# Patient Record
Sex: Female | Born: 1960 | Race: White | Hispanic: No | Smoking: Current every day smoker
Health system: Southern US, Community
[De-identification: ages and names within clinical notes are randomized; demographics above are authoritative.]

## PROBLEM LIST (undated history)

## (undated) DIAGNOSIS — T7840XA Allergy, unspecified, initial encounter: Secondary | ICD-10-CM

## (undated) DIAGNOSIS — E785 Hyperlipidemia, unspecified: Secondary | ICD-10-CM

## (undated) DIAGNOSIS — F32A Depression, unspecified: Secondary | ICD-10-CM

## (undated) DIAGNOSIS — M1990 Unspecified osteoarthritis, unspecified site: Secondary | ICD-10-CM

## (undated) DIAGNOSIS — M549 Dorsalgia, unspecified: Secondary | ICD-10-CM

## (undated) DIAGNOSIS — F1190 Opioid use, unspecified, uncomplicated: Secondary | ICD-10-CM

## (undated) DIAGNOSIS — F329 Major depressive disorder, single episode, unspecified: Secondary | ICD-10-CM

## (undated) DIAGNOSIS — I10 Essential (primary) hypertension: Secondary | ICD-10-CM

## (undated) DIAGNOSIS — B1920 Unspecified viral hepatitis C without hepatic coma: Secondary | ICD-10-CM

## (undated) DIAGNOSIS — F319 Bipolar disorder, unspecified: Secondary | ICD-10-CM

## (undated) DIAGNOSIS — Z5189 Encounter for other specified aftercare: Secondary | ICD-10-CM

## (undated) DIAGNOSIS — R569 Unspecified convulsions: Secondary | ICD-10-CM

## (undated) DIAGNOSIS — J449 Chronic obstructive pulmonary disease, unspecified: Secondary | ICD-10-CM

## (undated) HISTORY — DX: Hyperlipidemia, unspecified: E78.5

## (undated) HISTORY — DX: Depression, unspecified: F32.A

## (undated) HISTORY — DX: Chronic obstructive pulmonary disease, unspecified: J44.9

## (undated) HISTORY — DX: Essential (primary) hypertension: I10

## (undated) HISTORY — DX: Bipolar disorder, unspecified: F31.9

## (undated) HISTORY — PX: ABDOMINAL HYSTERECTOMY: SHX81

## (undated) HISTORY — DX: Unspecified convulsions: R56.9

## (undated) HISTORY — DX: Major depressive disorder, single episode, unspecified: F32.9

## (undated) HISTORY — DX: Unspecified viral hepatitis C without hepatic coma: B19.20

## (undated) HISTORY — DX: Unspecified osteoarthritis, unspecified site: M19.90

## (undated) HISTORY — DX: Encounter for other specified aftercare: Z51.89

## (undated) HISTORY — DX: Dorsalgia, unspecified: M54.9

## (undated) HISTORY — DX: Opioid use, unspecified, uncomplicated: F11.90

## (undated) HISTORY — DX: Allergy, unspecified, initial encounter: T78.40XA

---

## 2003-12-14 ENCOUNTER — Emergency Department: Payer: Self-pay | Admitting: Emergency Medicine

## 2006-09-29 ENCOUNTER — Emergency Department: Payer: Self-pay | Admitting: Internal Medicine

## 2007-06-21 ENCOUNTER — Emergency Department: Payer: Self-pay | Admitting: Emergency Medicine

## 2008-08-17 DIAGNOSIS — Z91148 Patient's other noncompliance with medication regimen for other reason: Secondary | ICD-10-CM | POA: Insufficient documentation

## 2008-11-29 DIAGNOSIS — F3130 Bipolar disorder, current episode depressed, mild or moderate severity, unspecified: Secondary | ICD-10-CM | POA: Insufficient documentation

## 2008-11-29 DIAGNOSIS — B182 Chronic viral hepatitis C: Secondary | ICD-10-CM | POA: Insufficient documentation

## 2011-12-16 DIAGNOSIS — S069X9A Unspecified intracranial injury with loss of consciousness of unspecified duration, initial encounter: Secondary | ICD-10-CM | POA: Insufficient documentation

## 2012-02-11 DIAGNOSIS — M549 Dorsalgia, unspecified: Secondary | ICD-10-CM | POA: Insufficient documentation

## 2012-02-11 DIAGNOSIS — R4183 Borderline intellectual functioning: Secondary | ICD-10-CM | POA: Insufficient documentation

## 2012-02-11 DIAGNOSIS — R413 Other amnesia: Secondary | ICD-10-CM | POA: Insufficient documentation

## 2012-02-11 DIAGNOSIS — B1920 Unspecified viral hepatitis C without hepatic coma: Secondary | ICD-10-CM | POA: Insufficient documentation

## 2012-02-11 DIAGNOSIS — J449 Chronic obstructive pulmonary disease, unspecified: Secondary | ICD-10-CM | POA: Insufficient documentation

## 2012-02-11 DIAGNOSIS — Z79891 Long term (current) use of opiate analgesic: Secondary | ICD-10-CM | POA: Insufficient documentation

## 2012-02-11 DIAGNOSIS — Z720 Tobacco use: Secondary | ICD-10-CM | POA: Insufficient documentation

## 2012-02-11 DIAGNOSIS — G40909 Epilepsy, unspecified, not intractable, without status epilepticus: Secondary | ICD-10-CM | POA: Insufficient documentation

## 2012-02-11 DIAGNOSIS — Z7689 Persons encountering health services in other specified circumstances: Secondary | ICD-10-CM | POA: Insufficient documentation

## 2012-02-11 DIAGNOSIS — F319 Bipolar disorder, unspecified: Secondary | ICD-10-CM | POA: Insufficient documentation

## 2012-04-23 DIAGNOSIS — F329 Major depressive disorder, single episode, unspecified: Secondary | ICD-10-CM | POA: Insufficient documentation

## 2012-04-27 ENCOUNTER — Ambulatory Visit: Payer: Self-pay | Admitting: Pain Medicine

## 2012-05-05 ENCOUNTER — Ambulatory Visit: Payer: Self-pay | Admitting: Pain Medicine

## 2012-05-06 ENCOUNTER — Ambulatory Visit: Payer: Self-pay | Admitting: Pain Medicine

## 2012-06-01 ENCOUNTER — Ambulatory Visit: Payer: Self-pay | Admitting: Pain Medicine

## 2012-06-09 ENCOUNTER — Ambulatory Visit: Payer: Self-pay | Admitting: Pain Medicine

## 2012-07-01 ENCOUNTER — Ambulatory Visit: Payer: Self-pay | Admitting: Pain Medicine

## 2012-07-05 DIAGNOSIS — Z9119 Patient's noncompliance with other medical treatment and regimen: Secondary | ICD-10-CM | POA: Insufficient documentation

## 2012-07-12 DIAGNOSIS — J45909 Unspecified asthma, uncomplicated: Secondary | ICD-10-CM | POA: Insufficient documentation

## 2012-07-12 DIAGNOSIS — J309 Allergic rhinitis, unspecified: Secondary | ICD-10-CM | POA: Insufficient documentation

## 2012-07-14 ENCOUNTER — Ambulatory Visit: Payer: Self-pay | Admitting: Pain Medicine

## 2012-07-28 ENCOUNTER — Ambulatory Visit: Payer: Self-pay | Admitting: Pain Medicine

## 2012-08-11 ENCOUNTER — Ambulatory Visit: Payer: Self-pay | Admitting: Pain Medicine

## 2012-08-26 ENCOUNTER — Ambulatory Visit: Payer: Self-pay | Admitting: Pain Medicine

## 2012-09-01 ENCOUNTER — Ambulatory Visit: Payer: Self-pay | Admitting: Pain Medicine

## 2012-09-17 DIAGNOSIS — M2550 Pain in unspecified joint: Secondary | ICD-10-CM | POA: Insufficient documentation

## 2012-09-28 ENCOUNTER — Ambulatory Visit: Payer: Self-pay | Admitting: Pain Medicine

## 2012-10-09 ENCOUNTER — Emergency Department: Payer: Self-pay | Admitting: Emergency Medicine

## 2012-10-18 ENCOUNTER — Ambulatory Visit: Payer: Self-pay | Admitting: Pain Medicine

## 2012-10-20 DIAGNOSIS — G43909 Migraine, unspecified, not intractable, without status migrainosus: Secondary | ICD-10-CM | POA: Insufficient documentation

## 2012-10-27 ENCOUNTER — Ambulatory Visit: Payer: Self-pay | Admitting: Pain Medicine

## 2012-11-03 ENCOUNTER — Ambulatory Visit: Payer: Self-pay | Admitting: Pain Medicine

## 2012-11-11 ENCOUNTER — Ambulatory Visit: Payer: Self-pay | Admitting: Family Medicine

## 2012-11-24 ENCOUNTER — Ambulatory Visit: Payer: Self-pay | Admitting: Pain Medicine

## 2012-12-05 DIAGNOSIS — H6590 Unspecified nonsuppurative otitis media, unspecified ear: Secondary | ICD-10-CM | POA: Insufficient documentation

## 2012-12-05 DIAGNOSIS — J329 Chronic sinusitis, unspecified: Secondary | ICD-10-CM | POA: Insufficient documentation

## 2012-12-06 ENCOUNTER — Ambulatory Visit: Payer: Self-pay | Admitting: Pain Medicine

## 2012-12-23 ENCOUNTER — Ambulatory Visit: Payer: Self-pay | Admitting: Pain Medicine

## 2012-12-24 ENCOUNTER — Ambulatory Visit: Payer: Self-pay | Admitting: Family Medicine

## 2013-01-17 ENCOUNTER — Ambulatory Visit: Payer: Self-pay | Admitting: Pain Medicine

## 2013-01-24 ENCOUNTER — Ambulatory Visit: Payer: Self-pay | Admitting: Pain Medicine

## 2013-01-31 DIAGNOSIS — R0902 Hypoxemia: Secondary | ICD-10-CM | POA: Insufficient documentation

## 2013-02-10 ENCOUNTER — Ambulatory Visit: Payer: Self-pay | Admitting: Otolaryngology

## 2013-02-22 ENCOUNTER — Ambulatory Visit: Payer: Self-pay | Admitting: Pain Medicine

## 2013-02-24 ENCOUNTER — Ambulatory Visit: Payer: Self-pay | Admitting: Internal Medicine

## 2013-02-24 LAB — CBC CANCER CENTER
Basophil %: 0.7 %
Eosinophil #: 0.2 x10 3/mm (ref 0.0–0.7)
Eosinophil %: 1.2 %
HCT: 45.1 % (ref 35.0–47.0)
HGB: 15.1 g/dL (ref 12.0–16.0)
Lymphocyte #: 4.4 x10 3/mm — ABNORMAL HIGH (ref 1.0–3.6)
Lymphocyte %: 31.2 %
Lymphocytes: 28 %
MCHC: 33.5 g/dL (ref 32.0–36.0)
MCV: 97 fL (ref 80–100)
Monocyte #: 0.8 x10 3/mm (ref 0.2–0.9)
Monocytes: 7 %
Neutrophil #: 8.7 x10 3/mm — ABNORMAL HIGH (ref 1.4–6.5)
Neutrophil %: 61.1 %
RDW: 13.3 % (ref 11.5–14.5)

## 2013-02-24 LAB — IRON AND TIBC
Iron Bind.Cap.(Total): 304 ug/dL (ref 250–450)
Iron Saturation: 19 %
Iron: 59 ug/dL (ref 50–170)

## 2013-02-24 LAB — FERRITIN: Ferritin (ARMC): 572 ng/mL — ABNORMAL HIGH (ref 8–388)

## 2013-03-10 ENCOUNTER — Ambulatory Visit: Payer: Self-pay | Admitting: Internal Medicine

## 2013-03-24 ENCOUNTER — Ambulatory Visit: Payer: Self-pay | Admitting: Pain Medicine

## 2013-04-06 ENCOUNTER — Ambulatory Visit: Payer: Self-pay | Admitting: Pain Medicine

## 2013-04-25 ENCOUNTER — Ambulatory Visit: Payer: Self-pay | Admitting: Pain Medicine

## 2013-05-12 DIAGNOSIS — R7989 Other specified abnormal findings of blood chemistry: Secondary | ICD-10-CM | POA: Insufficient documentation

## 2013-05-12 DIAGNOSIS — Z833 Family history of diabetes mellitus: Secondary | ICD-10-CM | POA: Insufficient documentation

## 2013-05-19 ENCOUNTER — Ambulatory Visit: Payer: Self-pay | Admitting: Internal Medicine

## 2013-05-19 LAB — CANCER CENTER HEMATOCRIT: HCT: 48.2 % — ABNORMAL HIGH (ref 35.0–47.0)

## 2013-05-24 ENCOUNTER — Ambulatory Visit: Payer: Self-pay | Admitting: Pain Medicine

## 2013-06-01 ENCOUNTER — Ambulatory Visit: Payer: Self-pay | Admitting: Pain Medicine

## 2013-06-04 DIAGNOSIS — D751 Secondary polycythemia: Secondary | ICD-10-CM | POA: Insufficient documentation

## 2013-06-04 DIAGNOSIS — M549 Dorsalgia, unspecified: Secondary | ICD-10-CM | POA: Insufficient documentation

## 2013-06-07 LAB — HEMATOCRIT: HCT: 46.8 % (ref 35.0–47.0)

## 2013-06-08 ENCOUNTER — Ambulatory Visit: Payer: Self-pay | Admitting: Internal Medicine

## 2013-06-23 ENCOUNTER — Ambulatory Visit: Payer: Self-pay | Admitting: Pain Medicine

## 2013-07-06 ENCOUNTER — Ambulatory Visit: Payer: Self-pay | Admitting: Pain Medicine

## 2013-07-21 ENCOUNTER — Ambulatory Visit: Payer: Self-pay | Admitting: Internal Medicine

## 2013-07-21 LAB — HEMATOCRIT: HCT: 47.9 % — AB (ref 35.0–47.0)

## 2013-07-25 ENCOUNTER — Ambulatory Visit: Payer: Self-pay | Admitting: Pain Medicine

## 2013-08-08 ENCOUNTER — Ambulatory Visit: Payer: Self-pay | Admitting: Internal Medicine

## 2013-08-11 LAB — HEMATOCRIT: HCT: 46.7 % (ref 35.0–47.0)

## 2013-08-23 ENCOUNTER — Ambulatory Visit: Payer: Self-pay | Admitting: Pain Medicine

## 2013-09-01 ENCOUNTER — Ambulatory Visit: Payer: Self-pay | Admitting: Internal Medicine

## 2013-09-01 LAB — CBC CANCER CENTER
BASOS ABS: 0.1 x10 3/mm (ref 0.0–0.1)
Basophil %: 1.3 %
EOS ABS: 0.1 x10 3/mm (ref 0.0–0.7)
EOS PCT: 1.3 %
HCT: 44.1 % (ref 35.0–47.0)
HGB: 14.4 g/dL (ref 12.0–16.0)
LYMPHS ABS: 3.6 x10 3/mm (ref 1.0–3.6)
Lymphocyte %: 32.2 %
MCH: 31.4 pg (ref 26.0–34.0)
MCHC: 32.7 g/dL (ref 32.0–36.0)
MCV: 96 fL (ref 80–100)
MONO ABS: 0.8 x10 3/mm (ref 0.2–0.9)
MONOS PCT: 7.3 %
NEUTROS PCT: 57.9 %
Neutrophil #: 6.4 x10 3/mm (ref 1.4–6.5)
Platelet: 301 x10 3/mm (ref 150–440)
RBC: 4.6 10*6/uL (ref 3.80–5.20)
RDW: 13 % (ref 11.5–14.5)
WBC: 11 x10 3/mm (ref 3.6–11.0)

## 2013-09-07 ENCOUNTER — Ambulatory Visit: Payer: Self-pay | Admitting: Internal Medicine

## 2013-09-07 ENCOUNTER — Ambulatory Visit: Payer: Self-pay | Admitting: Pain Medicine

## 2013-09-22 ENCOUNTER — Ambulatory Visit: Payer: Self-pay | Admitting: Pain Medicine

## 2013-12-02 DIAGNOSIS — J449 Chronic obstructive pulmonary disease, unspecified: Secondary | ICD-10-CM | POA: Insufficient documentation

## 2013-12-02 DIAGNOSIS — B182 Chronic viral hepatitis C: Secondary | ICD-10-CM | POA: Insufficient documentation

## 2013-12-05 DIAGNOSIS — F419 Anxiety disorder, unspecified: Secondary | ICD-10-CM | POA: Insufficient documentation

## 2013-12-28 ENCOUNTER — Ambulatory Visit: Payer: Self-pay | Admitting: Pain Medicine

## 2013-12-30 ENCOUNTER — Ambulatory Visit: Payer: Self-pay | Admitting: Family Medicine

## 2014-01-19 ENCOUNTER — Ambulatory Visit: Payer: Self-pay | Admitting: Pain Medicine

## 2014-01-26 ENCOUNTER — Ambulatory Visit: Payer: Self-pay | Admitting: Internal Medicine

## 2014-01-26 LAB — CBC CANCER CENTER
BASOS ABS: 0.1 x10 3/mm (ref 0.0–0.1)
Basophil %: 0.6 %
Eosinophil #: 0.2 x10 3/mm (ref 0.0–0.7)
Eosinophil %: 1.6 %
HCT: 47.1 % — AB (ref 35.0–47.0)
HGB: 15.5 g/dL (ref 12.0–16.0)
LYMPHS PCT: 29.8 %
Lymphocyte #: 3.1 x10 3/mm (ref 1.0–3.6)
MCH: 31.1 pg (ref 26.0–34.0)
MCHC: 32.9 g/dL (ref 32.0–36.0)
MCV: 95 fL (ref 80–100)
Monocyte #: 0.7 x10 3/mm (ref 0.2–0.9)
Monocyte %: 6.7 %
NEUTROS PCT: 61.3 %
Neutrophil #: 6.5 x10 3/mm (ref 1.4–6.5)
Platelet: 280 x10 3/mm (ref 150–440)
RBC: 4.98 10*6/uL (ref 3.80–5.20)
RDW: 13.4 % (ref 11.5–14.5)
WBC: 10.6 x10 3/mm (ref 3.6–11.0)

## 2014-02-07 ENCOUNTER — Ambulatory Visit: Payer: Self-pay | Admitting: Internal Medicine

## 2014-02-16 ENCOUNTER — Ambulatory Visit: Payer: Self-pay | Admitting: Pain Medicine

## 2014-02-20 ENCOUNTER — Ambulatory Visit: Payer: Self-pay | Admitting: Pain Medicine

## 2014-03-02 LAB — CANCER CENTER HEMATOCRIT: HCT: 48 % — ABNORMAL HIGH (ref 35.0–47.0)

## 2014-03-07 DIAGNOSIS — E785 Hyperlipidemia, unspecified: Secondary | ICD-10-CM | POA: Insufficient documentation

## 2014-03-10 ENCOUNTER — Ambulatory Visit: Payer: Self-pay | Admitting: Internal Medicine

## 2014-03-20 ENCOUNTER — Ambulatory Visit: Payer: Self-pay | Admitting: Pain Medicine

## 2014-04-03 ENCOUNTER — Ambulatory Visit: Payer: Self-pay | Admitting: Pain Medicine

## 2014-04-20 ENCOUNTER — Ambulatory Visit: Payer: Self-pay | Admitting: Pain Medicine

## 2014-04-27 ENCOUNTER — Ambulatory Visit: Payer: Self-pay | Admitting: Internal Medicine

## 2014-05-09 ENCOUNTER — Ambulatory Visit
Admit: 2014-05-09 | Disposition: A | Discharge status: Home / Self Care | Payer: Self-pay | Attending: Internal Medicine | Admitting: Internal Medicine

## 2014-05-18 ENCOUNTER — Ambulatory Visit: Payer: Self-pay | Admitting: Pain Medicine

## 2014-06-20 ENCOUNTER — Ambulatory Visit
Admit: 2014-06-20 | Disposition: A | Discharge status: Home / Self Care | Payer: Self-pay | Attending: Pain Medicine | Admitting: Pain Medicine

## 2014-06-22 ENCOUNTER — Ambulatory Visit
Admit: 2014-06-22 | Disposition: A | Discharge status: Home / Self Care | Payer: Self-pay | Attending: Internal Medicine | Admitting: Internal Medicine

## 2014-06-26 ENCOUNTER — Ambulatory Visit
Admit: 2014-06-26 | Disposition: A | Discharge status: Home / Self Care | Payer: Self-pay | Attending: Pain Medicine | Admitting: Pain Medicine

## 2014-07-15 ENCOUNTER — Other Ambulatory Visit: Payer: Self-pay | Admitting: *Deleted

## 2014-07-15 DIAGNOSIS — D729 Disorder of white blood cells, unspecified: Secondary | ICD-10-CM

## 2014-07-15 DIAGNOSIS — D72829 Elevated white blood cell count, unspecified: Secondary | ICD-10-CM

## 2014-07-15 DIAGNOSIS — D751 Secondary polycythemia: Secondary | ICD-10-CM

## 2014-07-17 ENCOUNTER — Other Ambulatory Visit: Payer: Self-pay | Admitting: *Deleted

## 2014-07-17 DIAGNOSIS — D751 Secondary polycythemia: Secondary | ICD-10-CM

## 2014-07-20 ENCOUNTER — Other Ambulatory Visit: Payer: Medicare Other

## 2014-07-20 ENCOUNTER — Ambulatory Visit: Payer: Medicare Other | Attending: Pain Medicine | Admitting: Pain Medicine

## 2014-07-20 ENCOUNTER — Encounter: Payer: Self-pay | Admitting: Pain Medicine

## 2014-07-20 ENCOUNTER — Inpatient Hospital Stay: Payer: Medicare Other | Attending: Internal Medicine | Admitting: Internal Medicine

## 2014-07-20 VITALS — BP 135/109 | HR 94 | Temp 97.6°F | Resp 15 | Ht 60.0 in | Wt 142.0 lb

## 2014-07-20 DIAGNOSIS — M5126 Other intervertebral disc displacement, lumbar region: Secondary | ICD-10-CM | POA: Insufficient documentation

## 2014-07-20 DIAGNOSIS — M47816 Spondylosis without myelopathy or radiculopathy, lumbar region: Secondary | ICD-10-CM

## 2014-07-20 DIAGNOSIS — M533 Sacrococcygeal disorders, not elsewhere classified: Secondary | ICD-10-CM | POA: Insufficient documentation

## 2014-07-20 DIAGNOSIS — M1288 Other specific arthropathies, not elsewhere classified, other specified site: Secondary | ICD-10-CM | POA: Diagnosis not present

## 2014-07-20 DIAGNOSIS — M5136 Other intervertebral disc degeneration, lumbar region: Secondary | ICD-10-CM | POA: Insufficient documentation

## 2014-07-20 DIAGNOSIS — G43909 Migraine, unspecified, not intractable, without status migrainosus: Secondary | ICD-10-CM | POA: Diagnosis not present

## 2014-07-20 DIAGNOSIS — M545 Low back pain: Secondary | ICD-10-CM | POA: Diagnosis present

## 2014-07-20 DIAGNOSIS — M5481 Occipital neuralgia: Secondary | ICD-10-CM | POA: Diagnosis not present

## 2014-07-20 MED ORDER — OXYCODONE HCL 5 MG PO CAPS: ORAL_CAPSULE | ORAL | Status: DC

## 2014-07-20 NOTE — Progress Notes (Signed)
Patient states 54-year-old female returns to [facility] management Center for further evaluation and treatment of lower back lower extremity pain with pain fairly well controlled at this time. Patient also with history of headaches which appear to be fairly well controlled at this time. Patient is to undergo further evaluation of [facility] as planned for interventional treatment at this time and continue patient's oxycodone  Physical examination  Patient was tenderness to palpation of paraspinal musculature and cervical region cervical facet region tenderness of the splenius capitis musculature region. To be bilaterally equal tenderness of thoracic facet thoracic paraspinal musculature region of moderate degree of spasm of the lower thoracic region tenderness of the PSIS and PI is regions of moderate degree. Mild tenderness of the greater trochanteric region and iliotibial band region PSIS and PII S regions. Straight leg raising limited to approximately 30 without increased pain with dorsiflexion. Abdomen without cues to palpation no costovertebral tenderness noted  Assessment   Degenerative disc disease lumbar spine This bulging posteriorly at L2-3 L3-4 L4-5 and L5-S1 with multilevel degenerative changes noted throughout the lumbar spine  Greater occipital neuralgia  Migraine headaches  Sacroiliac joint dysfunction  Facet syndrome  Plan  #1 continue oxycodone  #2 follow-up Dr. Greggory StallionGeorge for evaluation of blood pressure GI condition and general medical condition  #3 [facility] is planned  #4 may consider additional treatment pending response to treatment and follow-up evaluation number  Call [facility] change in condition prior to scheduled return appointment

## 2014-07-20 NOTE — Progress Notes (Signed)
   Subjective:    Patient ID: Sherri Foster, female    DOB: 06-19-1960, 54 y.o.   MRN: [id number]  HPI    Review of Systems     Objective:   Physical Exam        Assessment & Plan:

## 2014-07-20 NOTE — Progress Notes (Signed)
   Subjective:    Patient ID: Sherri Foster, female    DOB: 01/09/1961, 54 y.o.   MRN: [id number]  HPI    Review of Systems     Objective:   Physical Exam        Assessment & Plan:   

## 2014-07-20 NOTE — Patient Instructions (Addendum)
Continue present medications.  F/U PCP for evaliation of  BP and general medical  condition. Have patient see Dr. Greggory StallionGeorge for elevated blood pressure today or this week  [facility]  F/U surgical evaluation.  F/U nrurological evaluation.  May consider radiofrequency rhizolysis or intraspinal procedures pending response to present treatment and F/U evaluation.  Patient to call [facility] should patient have concerns prior to scheduled return appointment.

## 2014-07-20 NOTE — Progress Notes (Signed)
dishcarge  Patient home, ambulatory at 0905 hrs Script oxycodone given.

## 2014-07-20 NOTE — Progress Notes (Signed)
Pt going  To [facility] was put on harvoni, and now having diarrhea . [facility].

## 2014-07-27 ENCOUNTER — Telehealth: Payer: Self-pay | Admitting: Pain Medicine

## 2014-07-27 NOTE — Telephone Encounter (Signed)
Ms Sherri Foster daughter called to tell her mother selling meds for cocaine, and would like for us to have her mother called in for a random drug test. Daughter is very worried about mother.

## 2014-07-27 NOTE — Telephone Encounter (Signed)
Sherri SheldonAshley and Sherri BoerVicki,  As discussed I agreed that we call patient and request that she come today for urine drug screen  Please call patient now

## 2014-07-27 NOTE — Telephone Encounter (Signed)
Spoke with Chip BoerVicki and Dr Metta Clinesrisp and they agree to have the pt come in for random drug screen.   I attempted to call the pt at 2:45pm to informed pt she needed to be here by 4:00 today to the drug screen. Pt did not answer phone , i left detailed message stating pt needed to come to clinic today 07/27/14 before 4:00pm to give a random drug screen.   Pt daughter Silva BandyKristi also stated that she was gonna call Alean RinneGail Williams whom Ms Willeen CassBennett lives with and inform her that she needs to bring Ms Willeen CassBennett to the clinic today for the drug test.

## 2014-07-31 ENCOUNTER — Telehealth: Payer: Self-pay | Admitting: *Deleted

## 2014-07-31 NOTE — Telephone Encounter (Signed)
Pt was called multiple times with messages left on 07/27/14 stating she needed to come in and call us back.   Pt never showed up nor called

## 2014-07-31 NOTE — Telephone Encounter (Signed)
Morrie Sheldonshley  Thank you for trying To reach Mrs. Willeen CassBennett again please inform Chip BoerVicki as well of inability to reach patient

## 2014-07-31 NOTE — Telephone Encounter (Signed)
Phone call encounter closed. Pateint called and left voice mails/ messages. Did Not Respond.

## 2014-07-31 NOTE — Telephone Encounter (Signed)
Tried to call pt and phone goes right to vm . I left pt voice mail at 3:24 on 07/31/14

## 2014-07-31 NOTE — Telephone Encounter (Signed)
Annett FabianVicki, Ashley, and nurses  As a follow-up from my attempt to reach patient on Thursday, 07/27/2014, I am requesting that you call patient today and have patient report for urine drug screen today as well

## 2014-07-31 NOTE — Telephone Encounter (Signed)
closed

## 2014-08-02 ENCOUNTER — Other Ambulatory Visit: Payer: Self-pay | Admitting: Pain Medicine

## 2014-08-02 ENCOUNTER — Other Ambulatory Visit: Payer: Self-pay | Admitting: *Deleted

## 2014-08-02 DIAGNOSIS — D751 Secondary polycythemia: Secondary | ICD-10-CM

## 2014-08-02 DIAGNOSIS — D729 Disorder of white blood cells, unspecified: Secondary | ICD-10-CM

## 2014-08-02 DIAGNOSIS — D72829 Elevated white blood cell count, unspecified: Secondary | ICD-10-CM

## 2014-08-02 NOTE — Telephone Encounter (Signed)
Thank you for the follow-up information regarding this patient and request for patient to come to clinic for urine drug screen. Patient's most recent urine drug screen has been reviewed and is without evidence of the substance which patient was reported to be taking. We will also address issues with patient at time of patient's return appointment.

## 2014-08-02 NOTE — Telephone Encounter (Signed)
08/01/14 afternoon : Sherri Foster talk to Sherri Foster and she told her she was in [place] with her daughter and said she could not come in for the drug test.   08/02/14 9:40 per Sherri Foster: Daughter Sherri Foster  Called and stated mother was not in [place] and has not been there with her because she is the only daughter. Daughter has contacted a [employer] and the [employer] and they are checking into it and investigating the doctor as well as patient according to the daughter..  And daughter states that pt knows how to pass urine drug screen and would like a blood drug screen done.

## 2014-08-03 ENCOUNTER — Inpatient Hospital Stay: Payer: Medicare Other

## 2014-08-03 ENCOUNTER — Other Ambulatory Visit: Payer: Medicare Other

## 2014-08-15 ENCOUNTER — Other Ambulatory Visit: Payer: Self-pay | Admitting: Pain Medicine

## 2014-08-21 DIAGNOSIS — I10 Essential (primary) hypertension: Secondary | ICD-10-CM | POA: Insufficient documentation

## 2014-08-22 ENCOUNTER — Encounter: Payer: Self-pay | Admitting: Pain Medicine

## 2014-08-22 ENCOUNTER — Ambulatory Visit: Payer: Medicare Other | Attending: Pain Medicine | Admitting: Pain Medicine

## 2014-08-22 VITALS — BP 114/79 | HR 79 | Temp 97.7°F | Resp 16 | Ht 60.0 in | Wt 156.0 lb

## 2014-08-22 DIAGNOSIS — M5481 Occipital neuralgia: Secondary | ICD-10-CM | POA: Diagnosis not present

## 2014-08-22 DIAGNOSIS — M533 Sacrococcygeal disorders, not elsewhere classified: Secondary | ICD-10-CM | POA: Diagnosis not present

## 2014-08-22 DIAGNOSIS — M545 Low back pain: Secondary | ICD-10-CM | POA: Diagnosis present

## 2014-08-22 DIAGNOSIS — M7060 Trochanteric bursitis, unspecified hip: Secondary | ICD-10-CM | POA: Diagnosis not present

## 2014-08-22 DIAGNOSIS — M5136 Other intervertebral disc degeneration, lumbar region: Secondary | ICD-10-CM | POA: Diagnosis not present

## 2014-08-22 DIAGNOSIS — M5126 Other intervertebral disc displacement, lumbar region: Secondary | ICD-10-CM | POA: Insufficient documentation

## 2014-08-22 DIAGNOSIS — M47816 Spondylosis without myelopathy or radiculopathy, lumbar region: Secondary | ICD-10-CM | POA: Insufficient documentation

## 2014-08-22 MED ORDER — OXYCODONE HCL 5 MG PO CAPS: ORAL_CAPSULE | ORAL | Status: DC

## 2014-08-22 NOTE — Progress Notes (Signed)
   Subjective:    Patient ID: Sherri Foster, female    DOB: Dec 19, 1960, 54 y.o.   MRN: [id number]  HPI  Patient is 54 year old female who [facility] for further evaluation and treatment of pain involving the lower back and lower extremity region predominantly with pain aggravated by standing walking and twisting and turning maneuvers. The patient states that the pain also is increased with turning over in bed. Patient states the pain is more intense after prolonged standing and walking. Patient admits to significant muscle spasms occurring with the back pain after prolonged standing and walking especially. Patient states she also has headaches with pain radiating from the neck to the back of the head. We discussed patient's condition and will consider patient for lumbar facet, medial branch nerve, blocks at time return appointment in attempt to decrease severity of symptoms, minimize progression of symptoms, and hopefully avoid the need for more involved treatment. The patient was understanding and in agreement with suggested treatment plan.      Review of Systems     Objective:   Physical Exam  There was tennis to palpation over the splenius capitis and occipitalis musculature regions. Palpation over the region of the cervical facet cervical paraspinal musculature region was associated with moderate discomfort. There was no evidence of newly formed lesions of the head and neck noted. Palpation over the cervical facet cervical paraspinal musculature region and thoracic facet thoracic paraspinal musculature region reproduced moderate discomfort. There was tends to palpation over the lumbar paraspinal musculature region lumbar facet region of moderate to moderately severe degree with extension and rotation and palpation over the lumbar facets reproducing moderate to moderately severe discomfort. There was moderate tenderness over the PSIS PSIS regions. Palpation of the  gluteal and piriformis musculature regions reproduced moderate discomfort as well. Leg raising was tolerates approximately 30 without increase of pain with dorsiflexion noted. There was negative clonus negative Homans. No definite sensory deficit of dermatomal distribution detected. Abdomen nontender tender and no costovertebral tenderness was noted.      Assessment & Plan:  Degenerative disc disease lumbar spine  posterior disc bulging L2-3, L3-4, L4-5 and L5-S1 with multilevel degenerative changes noted throughout the lumbar spine  Lumbar facet syndrome  Sacroiliac joint dysfunction  Greater occipital neuralgia, bilateral  Greater trochanteric bursitis    Plan  Continue present medications. Oxycodone   Lumbar facet, medial branch nerve, blocks to be performed at time return appointment  F/U PCP for evaliation of  BP and general medical  condition.  F/U surgical evaluation.  F/U neurological evaluation.  May consider radiofrequency rhizolysis or intraspinal procedures pending response to present treatment and F/U evaluation.  Patient to call [facility] should patient have concerns prior to scheduled return appointment.

## 2014-08-22 NOTE — Patient Instructions (Addendum)
Continue present medications. Oxycodone  Lumbar facet blocks Wednesday, 09/06/2014  F/U PCP for evaliation of  BP and general medical  condition. Follow-up Dr. Greggory Stallion for evaluation of blood pressure to evaluate response to medication prescribed by Dr. Greggory Stallion  F/U surgical evaluation.  F/U neurological evaluation.  May consider radiofrequency rhizolysis or intraspinal procedures pending response to present treatment and F/U evaluation.  Patient to call [facility] should patient have concerns prior to scheduled return appointment. GENERAL RISKS AND COMPLICATIONS  What are the risk, side effects and possible complications? Generally speaking, most procedures are safe.  However, with any procedure there are risks, side effects, and the possibility of complications.  The risks and complications are dependent upon the sites that are lesioned, or the type of nerve block to be performed.  The closer the procedure is to the spine, the more serious the risks are.  Great care is taken when placing the radio frequency needles, block needles or lesioning probes, but sometimes complications can occur. 1. Infection: Any time there is an injection through the skin, there is a risk of infection.  This is why sterile conditions are used for these blocks.  There are four possible types of infection. 1. Localized skin infection. 2. Central Nervous System Infection-This can be in the form of Meningitis, which can be deadly. 3. Epidural Infections-This can be in the form of an epidural abscess, which can cause pressure inside of the spine, causing compression of the spinal cord with subsequent paralysis. This would require an emergency surgery to decompress, and there are no guarantees that the patient would recover from the paralysis. 4. Discitis-This is an infection of the intervertebral discs.  It occurs in about 1% of discography procedures.  It is difficult to treat and it may lead to surgery.         2. Pain: the needles have to go through skin and soft tissues, will cause soreness.       3. Damage to internal structures:  The nerves to be lesioned may be near blood vessels or    other nerves which can be potentially damaged.       4. Bleeding: Bleeding is more common if the patient is taking blood thinners such as  aspirin, Coumadin, Ticiid, Plavix, etc., or if he/she have some genetic predisposition  such as hemophilia. Bleeding into the spinal canal can cause compression of the spinal  cord with subsequent paralysis.  This would require an emergency surgery to  decompress and there are no guarantees that the patient would recover from the  paralysis.       5. Pneumothorax:  Puncturing of a lung is a possibility, every time a needle is introduced in  the area of the chest or upper back.  Pneumothorax refers to free air around the  collapsed lung(s), inside of the thoracic cavity (chest cavity).  Another two possible  complications related to a similar event would include: Hemothorax and Chylothorax.   These are variations of the Pneumothorax, where instead of air around the collapsed  lung(s), you may have blood or chyle, respectively.       6. Spinal headaches: They may occur with any procedures in the area of the spine.       7. Persistent CSF (Cerebro-Spinal Fluid) leakage: This is a rare problem, but may occur  with prolonged intrathecal or epidural catheters either due to the formation of a fistulous  track or a dural tear.  8. Nerve damage: By working so close to the spinal cord, there is always a possibility of  nerve damage, which could be as serious as a permanent spinal cord injury with  paralysis.       9. Death:  Although rare, severe deadly allergic reactions known as "Anaphylactic  reaction" can occur to any of the medications used.      10. Worsening of the symptoms:  We can always make thing worse.  What are the chances of something like this happening? Chances of any of this  occuring are extremely low.  By statistics, you have more of a chance of getting killed in a motor vehicle accident: while driving to the hospital than any of the above occurring .  Nevertheless, you should be aware that they are possibilities.  In general, it is similar to taking a shower.  Everybody knows that you can slip, hit your head and get killed.  Does that mean that you should not shower again?  Nevertheless always keep in mind that statistics do not mean anything if you happen to be on the wrong side of them.  Even if a procedure has a 1 (one) in a 1,000,000 (million) chance of going wrong, it you happen to be that one..Also, keep in mind that by statistics, you have more of a chance of having something go wrong when taking medications.  Who should not have this procedure? If you are on a blood thinning medication (e.g. Coumadin, Plavix, see list of "Blood Thinners"), or if you have an active infection going on, you should not have the procedure.  If you are taking any blood thinners, please inform your physician.  How should I prepare for this procedure?  Do not eat or drink anything at least six hours prior to the procedure.  Bring a driver with you .  It cannot be a taxi.  Come accompanied by an adult that can drive you back, and that is strong enough to help you if your legs get weak or numb from the local anesthetic.  Take all of your medicines the morning of the procedure with just enough water to swallow them.  If you have diabetes, make sure that you are scheduled to have your procedure done first thing in the morning, whenever possible.  If you have diabetes, take only half of your insulin dose and notify our nurse that you have done so as soon as you arrive at the clinic.  If you are diabetic, but only take blood sugar pills (oral hypoglycemic), then do not take them on the morning of your procedure.  You may take them after you have had the procedure.  Do not take aspirin  or any aspirin-containing medications, at least eleven (11) days prior to the procedure.  They may prolong bleeding.  Wear loose fitting clothing that may be easy to take off and that you would not mind if it got stained with Betadine or blood.  Do not wear any jewelry or perfume  Remove any nail coloring.  It will interfere with some of our monitoring equipment.  NOTE: Remember that this is not meant to be interpreted as a complete list of all possible complications.  Unforeseen problems may occur.  BLOOD THINNERS The following drugs contain aspirin or other products, which can cause increased bleeding during surgery and should not be taken for 2 weeks prior to and 1 week after surgery.  If you should need take something for relief of minor  pain, you may take acetaminophen which is found in Tylenol,m Datril, Anacin-3 and Panadol. It is not blood thinner. The products listed below are.  Do not take any of the products listed below in addition to any listed on your instruction sheet.  A.P.C or A.P.C with Codeine Codeine Phosphate Capsules #3 Ibuprofen Ridaura  ABC compound Congesprin Imuran rimadil  Advil Cope Indocin Robaxisal  Alka-Seltzer Effervescent Pain Reliever and Antacid Coricidin or Coricidin-D  Indomethacin Rufen  Alka-Seltzer plus Cold Medicine Cosprin Ketoprofen S-A-C Tablets  Anacin Analgesic Tablets or Capsules Coumadin Korlgesic Salflex  Anacin Extra Strength Analgesic tablets or capsules CP-2 Tablets Lanoril Salicylate  Anaprox Cuprimine Capsules Levenox Salocol  Anexsia-D Dalteparin Magan Salsalate  Anodynos Darvon compound Magnesium Salicylate Sine-off  Ansaid Dasin Capsules Magsal Sodium Salicylate  Anturane Depen Capsules Marnal Soma  APF Arthritis pain formula Dewitt's Pills Measurin Stanback  Argesic Dia-Gesic Meclofenamic Sulfinpyrazone  Arthritis Bayer Timed Release Aspirin Diclofenac Meclomen Sulindac  Arthritis pain formula Anacin Dicumarol Medipren Supac   Analgesic (Safety coated) Arthralgen Diffunasal Mefanamic Suprofen  Arthritis Strength Bufferin Dihydrocodeine Mepro Compound Suprol  Arthropan liquid Dopirydamole Methcarbomol with Aspirin Synalgos  ASA tablets/Enseals Disalcid Micrainin Tagament  Ascriptin Doan's Midol Talwin  Ascriptin A/D Dolene Mobidin Tanderil  Ascriptin Extra Strength Dolobid Moblgesic Ticlid  Ascriptin with Codeine Doloprin or Doloprin with Codeine Momentum Tolectin  Asperbuf Duoprin Mono-gesic Trendar  Aspergum Duradyne Motrin or Motrin IB Triminicin  Aspirin plain, buffered or enteric coated Durasal Myochrisine Trigesic  Aspirin Suppositories Easprin Nalfon Trillsate  Aspirin with Codeine Ecotrin Regular or Extra Strength Naprosyn Uracel  Atromid-S Efficin Naproxen Ursinus  Auranofin Capsules Elmiron Neocylate Vanquish  Axotal Emagrin Norgesic Verin  Azathioprine Empirin or Empirin with Codeine Normiflo Vitamin E  Azolid Emprazil Nuprin Voltaren  Bayer Aspirin plain, buffered or children's or timed BC Tablets or powders Encaprin Orgaran Warfarin Sodium  Buff-a-Comp Enoxaparin Orudis Zorpin  Buff-a-Comp with Codeine Equegesic Os-Cal-Gesic   Buffaprin Excedrin plain, buffered or Extra Strength Oxalid   Bufferin Arthritis Strength Feldene Oxphenbutazone   Bufferin plain or Extra Strength Feldene Capsules Oxycodone with Aspirin   Bufferin with Codeine Fenoprofen Fenoprofen Pabalate or Pabalate-SF   Buffets II Flogesic Panagesic   Buffinol plain or Extra Strength Florinal or Florinal with Codeine Panwarfarin   Buf-Tabs Flurbiprofen Penicillamine   Butalbital Compound Four-way cold tablets Penicillin   Butazolidin Fragmin Pepto-Bismol   Carbenicillin Geminisyn Percodan   Carna Arthritis Reliever Geopen Persantine   Carprofen Gold's salt Persistin   Chloramphenicol Goody's Phenylbutazone   Chloromycetin Haltrain Piroxlcam   Clmetidine heparin Plaquenil   Cllnoril Hyco-pap Ponstel   Clofibrate Hydroxy  chloroquine Propoxyphen         Before stopping any of these medications, be sure to consult the physician who ordered them.  Some, such as Coumadin (Warfarin) are ordered to prevent or treat serious conditions such as "deep thrombosis", "pumonary embolisms", and other heart problems.  The amount of time that you may need off of the medication may also vary with the medication and the reason for which you were taking it.  If you are taking any of these medications, please make sure you notify your pain physician before you undergo any procedures.         Facet Joint Block The facet joints connect the bones of the spine (vertebrae). They make it possible for you to bend, twist, and make other movements with your spine. They also prevent you from overbending, overtwisting, and making other excessive movements.  A facet  joint block is a procedure where a numbing medicine (anesthetic) is injected into a facet joint. Often, a type of anti-inflammatory medicine called a steroid is also injected. A facet joint block may be done for two reasons:  2. Diagnosis. A facet joint block may be done as a test to see whether neck or back pain is caused by a worn-down or infected facet joint. If the pain gets better after a facet joint block, it means the pain is probably coming from the facet joint. If the pain does not get better, it means the pain is probably not coming from the facet joint.  3. Therapy. A facet joint block may be done to relieve neck or back pain caused by a facet joint. A facet joint block is only done as a therapy if the pain does not improve with medicine, exercise programs, physical therapy, and other forms of pain management. LET [facility] CARE PROVIDER KNOW ABOUT:   Any allergies you have.   All medicines you are taking, including vitamins, herbs, eyedrops, and over-the-counter medicines and creams.   Previous problems you or members of your family have had with the use of  anesthetics.   Any blood disorders you have had.   Other health problems you have. RISKS AND COMPLICATIONS Generally, having a facet joint block is safe. However, as with any procedure, complications can occur. Possible complications associated with having a facet joint block include:   Bleeding.   Injury to a nerve near the injection site.   Pain at the injection site.   Weakness or numbness in areas controlled by nerves near the injection site.   Infection.   Temporary fluid retention.   Allergic reaction to anesthetics or medicines used during the procedure. BEFORE THE PROCEDURE   Follow your health care provider's instructions if you are taking dietary supplements or medicines. You may need to stop taking them or reduce your dosage.   Do not take any new dietary supplements or medicines without asking your health care provider first.   Follow your health care provider's instructions about eating and drinking before the procedure. You may need to stop eating and drinking several hours before the procedure.   Arrange to have an adult drive you home after the procedure. PROCEDURE 12. You may need to remove your clothing and dress in an open-back gown so that your health care provider can access your spine.  13. The procedure will be done while you are lying on an X-ray table. Most of the time you will be asked to lie on your stomach, but you may be asked to lie in a different position if an injection will be made in your neck.  14. Special machines will be used to monitor your oxygen levels, heart rate, and blood pressure.  15. If an injection will be made in your neck, an intravenous (IV) tube will be inserted into one of your veins. Fluids and medicine will flow directly into your body through the IV tube.  16. The area over the facet joint where the injection will be made will be cleaned with an antiseptic soap. The surrounding skin will be covered with sterile  drapes.  17. An anesthetic will be applied to your skin to make the injection area numb. You may feel a temporary stinging or burning sensation.  18. A video X-ray machine will be used to locate the joint. A contrast dye may be injected into the facet joint area to help with locating  the joint.  19. When the joint is located, an anesthetic medicine will be injected into the joint through the needle.  42. Your health care provider will ask you whether you feel pain relief. If you do feel relief, a steroid may be injected to provide pain relief for a longer period of time. If you do not feel relief or feel only partial relief, additional injections of an anesthetic may be made in other facet joints.  21. The needle will be removed, the skin will be cleansed, and bandages will be applied.  AFTER THE PROCEDURE   You will be observed for 15-30 minutes before being allowed to go home. Do not drive. Have an adult drive you or take a taxi or public transportation instead.   If you feel pain relief, the pain will return in several hours or days when the anesthetic wears off.   You may feel pain relief 2-14 days after the procedure. The amount of time this relief lasts varies from person to person.   It is normal to feel some tenderness over the injected area(s) for 2 days following the procedure.   If you have diabetes, you may have a temporary increase in blood sugar. Document Released: 07/16/2006 Document Revised: 07/11/2013 Document Reviewed: 12/15/2011 [facility] Patient Information 2015 [place], [place]. This information is not intended to replace advice given to you by your health care provider. Make sure you discuss any questions you have with your health care provider.

## 2014-08-22 NOTE — Progress Notes (Signed)
Discharged to home ambulatory.  Pre procedure instructions given with teach back 3 done. Script for oxycodone in hand

## 2014-09-06 ENCOUNTER — Encounter: Payer: Self-pay | Admitting: Pain Medicine

## 2014-09-06 ENCOUNTER — Ambulatory Visit: Payer: Medicare Other | Attending: Pain Medicine | Admitting: Pain Medicine

## 2014-09-06 VITALS — BP 121/79 | HR 76 | Temp 97.8°F | Resp 18 | Ht 60.0 in | Wt 152.0 lb

## 2014-09-06 DIAGNOSIS — M5136 Other intervertebral disc degeneration, lumbar region: Secondary | ICD-10-CM | POA: Diagnosis not present

## 2014-09-06 DIAGNOSIS — M79605 Pain in left leg: Secondary | ICD-10-CM | POA: Diagnosis present

## 2014-09-06 DIAGNOSIS — M545 Low back pain: Secondary | ICD-10-CM | POA: Diagnosis present

## 2014-09-06 DIAGNOSIS — M79604 Pain in right leg: Secondary | ICD-10-CM | POA: Diagnosis present

## 2014-09-06 DIAGNOSIS — M47816 Spondylosis without myelopathy or radiculopathy, lumbar region: Secondary | ICD-10-CM

## 2014-09-06 DIAGNOSIS — M5126 Other intervertebral disc displacement, lumbar region: Secondary | ICD-10-CM | POA: Insufficient documentation

## 2014-09-06 DIAGNOSIS — M5481 Occipital neuralgia: Secondary | ICD-10-CM

## 2014-09-06 DIAGNOSIS — M533 Sacrococcygeal disorders, not elsewhere classified: Secondary | ICD-10-CM

## 2014-09-06 MED ORDER — MIDAZOLAM HCL 5 MG/5ML IJ SOLN
INTRAMUSCULAR | Status: AC
  Administered 2014-09-06: 3 mg via INTRAVENOUS
  Filled 2014-09-06: qty 5

## 2014-09-06 MED ORDER — FENTANYL CITRATE (PF) 100 MCG/2ML IJ SOLN
INTRAMUSCULAR | Status: AC
  Administered 2014-09-06: 100 ug via INTRAVENOUS
  Filled 2014-09-06: qty 2

## 2014-09-06 MED ORDER — BUPIVACAINE HCL (PF) 0.25 % IJ SOLN
INTRAMUSCULAR | Status: AC
  Administered 2014-09-06: 30 mL
  Filled 2014-09-06: qty 30

## 2014-09-06 MED ORDER — TRIAMCINOLONE ACETONIDE 40 MG/ML IJ SUSP
INTRAMUSCULAR | Status: AC
  Administered 2014-09-06: 40 mg
  Filled 2014-09-06: qty 1

## 2014-09-06 MED ORDER — OXYCODONE HCL 5 MG PO CAPS: ORAL_CAPSULE | ORAL | Status: DC

## 2014-09-06 MED ORDER — ORPHENADRINE CITRATE 30 MG/ML IJ SOLN
INTRAMUSCULAR | Status: AC
  Administered 2014-09-06: 60 mg
  Filled 2014-09-06: qty 2

## 2014-09-06 NOTE — Patient Instructions (Addendum)
Continue present medications oxycodone   F/U PCP for evaliation of  BP and general medical  condition.  F/U surgical evaluation  F/U neurological evaluation  May consider radiofrequency rhizolysis or intraspinal procedures pending response to present treatment and F/U evaluation.  Patient to call [facility] Management Center should patient have concerns prior to scheduled return appointment.   Pain Management Discharge Instructions  General Discharge Instructions :  If you need to reach your doctor call: Monday-Friday 8:00 am - 4:00 pm at [phone] or toll free [phone].  After clinic hours [phone] to have operator reach doctor.  Bring all of your medication bottles to all your appointments in the pain clinic.  To cancel or reschedule your appointment with Pain Management please remember to call 24 hours in advance to avoid a fee.  Refer to the educational materials which you have been given on: General Risks, I had my Procedure. Discharge Instructions, Post Sedation.  Post Procedure Instructions:  The drugs you were given will stay in your system until tomorrow, so for the next 24 hours you should not drive, make any legal decisions or drink any alcoholic beverages.  You may eat anything you prefer, but it is better to start with liquids then soups and crackers, and gradually work up to solid foods.  Please notify your doctor immediately if you have any unusual bleeding, trouble breathing or pain that is not related to your normal pain.  Depending on the type of procedure that was done, some parts of your body may feel week and/or numb.  This usually clears up by tonight or the next day.  Walk with the use of an assistive device or accompanied by an adult for the 24 hours.  You may use ice on the affected area for the first 24 hours.  Put ice in a Ziploc bag and cover with a towel and place against area 15 minutes on 15 minutes off.  You may switch to heat after 24  hours.

## 2014-09-06 NOTE — Progress Notes (Signed)
Subjective:    Patient ID: Sherri Foster, female    DOB: 05/27/1960, 54 y.o.   MRN: [id number]  HPI  PROCEDURE PERFORMED: Lumbar facet (medial branch block)   NOTE: The patient is a 54 y.o. female who returns to [facility] for further evaluation and treatment of pain involving the lumbar and lower extremity region. MRI  revealed the patient to be with evidence of multilevel degenerative disc disease lumbar spine with posterior disc bulging L2-3, L3-4, L4-5, and L5-S1. 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 The risks, benefits, and expectations of the procedure have been discussed and explained to the patient who was understanding and in agreement with suggested treatment plan. We will proceed with interventional treatment as discussed and as explained to the patient who was understanding and wished to proceed with procedure as planned.   DESCRIPTION OF PROCEDURE: Lumbar facet (medial branch block) with IV Versed, IV fentanyl conscious sedation, EKG, blood pressure, pulse, and pulse oximetry monitoring. The procedure was performed with the patient in the prone position. Betadine prep of proposed entry site performed.   NEEDLE PLACEMENT AT: Left L 3 lumbar facet (medial branch block). Under fluoroscopic guidance with oblique orientation of 15 degrees, a 22-gauge needle was inserted at the L 3 vertebral body level with needle placed at the targeted area of Burton's Eye or Eye of the Scotty Dog with documentation of needle placement in the superior and lateral border of targeted area of Burton's Eye or Eye of the Scotty Dog with oblique orientation of 15 degrees. Following documentation of needle placement at the L 3 vertebral body level, needle placement was then accomplished at the L 4 vertebral body level.   NEEDLE PLACEMENT AT L4 and L5 VERTEBRAL BODY LEVELS ON THE LEFT SIDE The procedure was performed at the L4 and L5  vertebral body levels exactly as was performed at the L 3 vertebral body level utilizing the same technique and under fluoroscopic guidance.  NEEDLE PLACEMENT AT THE SACRAL ALA with AP view of the lumbosacral spine. With the patient in the prone position, Betadine prep of proposed entry site accomplished, a 22 gauge needle was inserted in the region of the sacral ala (groove formed by the superior articulating process of S1 and the sacral wing). Following documentation of needle placement at the sacral ala,  needle placement was then accomplished at the S1 foramen level.   NEEDLE PLACEMENT AT THE S1 FORAMEN LEVEL under fluoroscopic guidance with AP view of the lumbosacral spine and cephalad orientation of the fluoroscope, a 22-gauge needle was placed at the superior and lateral border of the S1 foramen under fluoroscopic guidance. Following documentation of needle placement at the S1 foramen.   Needle placement was then verified at all levels on lateral view. Following documentation of needle placement at all levels on lateral view and following negative aspiration for heme and CSF, each level was injected with 1 mL of 0.25% bupivacaine with Kenalog.     LUMBAR FACET, MEDIAL BRANCH NERVE, BLOCKS PERFORMED ON THE RIGHT SIDE   The procedure was performed on the right side exactly as was performed on the left side at the same levels and utilizing the same technique under fluoroscopic guidance.     The patient tolerated the procedure well. A total of 40 mg of Kenalog was utilized for the procedure.   PLAN:  1. Medications: The patient will continue presently prescribed medications. Oxycodone 2. May consider modification  of treatment regimen at time of return appointment pending response to treatment rendered on today's visit. 3. The patient is to follow-up with primary care physician for further evaluation of blood pressure and general medical condition status post steroid injection performed on  today's visit. 4. Surgical follow-up evaluation. 5. Neurological follow-up evaluation. 6. The patient may be candidate for radiofrequency procedures, implantation type procedures, and other treatment pending response to treatment and follow-up evaluation. 7. The patient has been advised to call the Pain Management Center prior to scheduled return appointment should there be significant change in condition or should patient have other concerns regarding condition prior to scheduled return appointment.  The patient is understanding and in agreement with suggested treatment plan.      Review of Systems     Objective:   Physical Exam        Assessment & Plan:

## 2014-09-06 NOTE — Progress Notes (Signed)
Safety precautions to be maintained throughout the outpatient stay will include: orient to surroundings, keep bed in low position, maintain call bell within reach at all times, provide assistance with transfer out of bed and ambulation.  

## 2014-09-07 ENCOUNTER — Telehealth: Payer: Self-pay | Admitting: *Deleted

## 2014-09-07 NOTE — Telephone Encounter (Signed)
Patient denies any complications post procedure 

## 2014-09-14 ENCOUNTER — Other Ambulatory Visit: Payer: Self-pay | Admitting: Pain Medicine

## 2014-09-19 ENCOUNTER — Ambulatory Visit: Payer: Medicare Other | Attending: Pain Medicine | Admitting: Pain Medicine

## 2014-09-19 ENCOUNTER — Encounter: Payer: Self-pay | Admitting: Pain Medicine

## 2014-09-19 VITALS — BP 127/97 | HR 108 | Temp 97.6°F | Resp 18 | Ht 60.0 in | Wt 150.0 lb

## 2014-09-19 DIAGNOSIS — M5481 Occipital neuralgia: Secondary | ICD-10-CM | POA: Diagnosis not present

## 2014-09-19 DIAGNOSIS — M5136 Other intervertebral disc degeneration, lumbar region: Secondary | ICD-10-CM | POA: Diagnosis not present

## 2014-09-19 DIAGNOSIS — M47816 Spondylosis without myelopathy or radiculopathy, lumbar region: Secondary | ICD-10-CM

## 2014-09-19 DIAGNOSIS — M533 Sacrococcygeal disorders, not elsewhere classified: Secondary | ICD-10-CM | POA: Diagnosis not present

## 2014-09-19 DIAGNOSIS — M545 Low back pain: Secondary | ICD-10-CM | POA: Diagnosis present

## 2014-09-19 DIAGNOSIS — M79605 Pain in left leg: Secondary | ICD-10-CM | POA: Diagnosis present

## 2014-09-19 DIAGNOSIS — M79604 Pain in right leg: Secondary | ICD-10-CM | POA: Diagnosis present

## 2014-09-19 MED ORDER — OXYCODONE HCL 5 MG PO TABS: ORAL_TABLET | ORAL | Status: DC

## 2014-09-19 NOTE — Progress Notes (Signed)
   Subjective:    Patient ID: Sherri Foster, female    DOB: 05-18-1960, 54 y.o.   MRN: [id number]  HPI Patient is 54 year old female [facility] and treatment of pain involving the lower back and lower extremity region. Patient states that she had significant improvement of her pain following previous interventional treatment performed in [facility]. Patient states that her headaches have been well well controlled as well. Patient denies trauma change in events of daily living the call significant change in symptomatology. We will continue oxycodone and remain available to consider modification of treatment should there be significant change in patient's condition. The patient was understanding and in agreement with suggested treatment plan.       Review of Systems     Objective:   Physical Exam  Mild tinnitus of the splenius capitis of talus region. Palpation over the region of the cervical facet cervical paraspinal musculature region and thoracic facet thoracic paraspinal musculature region reproduced minimal discomfort. There was tenderness over the acromioclavicular glenohumeral joint region palpation which reproduced pain of mild degree. There was unremarkable Spurling's maneuver. Patient appeared to be with bilaterally equal grip strength. Tinel and Phalen's maneuver were without increase of pain of significant degree. Palpation over the thoracic facet thoracic paraspinal musculature region reproduced mild discomfort. No crepitus of the thoracic region noted. Palpation over the lumbar paraspinal musculature region lumbar facet region was associated with mild discomfort. Lateral bending and rotation and extension to palpation of the lumbar facets reproduce mild discomfort. Palpation over the PSIS and PII S region associated with mild discomfort. There was no significant increase of pain with pressure prior to the ilium with patient  in lateral decubitus position. StraightLeg raising was tolerated to approximately 30 without an increase of pain with dorsiflexion noted. There was negative clonus negative Homans. DTRs trace at the knees. No sensory deficit of dermatomal distribution detected. Abdomen nontender and no costovertebral maintenance noted.      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 Greggory StallionGeorge for evaliation of  BP and general medical  condition.  F/U surgical evaluation  F/U neurological evaluation  May consider radiofrequency rhizolysis or intraspinal procedures pending response to present treatment and F/U evaluation.  [facility] to call [facility] should patient have concerns prior to scheduled return appointment.

## 2014-09-19 NOTE — Progress Notes (Signed)
Safety precautions to be maintained throughout the outpatient stay will include: orient to surroundings, keep bed in low position, maintain call bell within reach at all times, provide assistance with transfer out of bed and ambulation.  

## 2014-09-19 NOTE — Patient Instructions (Addendum)
Continue present medications oxycodone   F/U PCP for evaliation of  BP and general medical  condition.  F/U surgical evaluation  F/U neurological evaluation  May consider radiofrequency rhizolysis or intraspinal procedures pending response to present treatment and F/U evaluation.  Patient to call [facility] should patient have concerns prior to scheduled return appointment.  You were given a prescription for Oxycodone today.

## 2014-10-18 ENCOUNTER — Encounter: Payer: Self-pay | Admitting: Medical Oncology

## 2014-10-18 ENCOUNTER — Emergency Department
Admission: EM | Admit: 2014-10-18 | Discharge: 2014-10-19 | Disposition: A | Discharge status: Other Acute Inpatient Hospital | Payer: Medicare Other | Attending: Emergency Medicine | Admitting: Emergency Medicine

## 2014-10-18 DIAGNOSIS — I10 Essential (primary) hypertension: Secondary | ICD-10-CM | POA: Diagnosis not present

## 2014-10-18 DIAGNOSIS — Z7951 Long term (current) use of inhaled steroids: Secondary | ICD-10-CM | POA: Diagnosis not present

## 2014-10-18 DIAGNOSIS — F29 Unspecified psychosis not due to a substance or known physiological condition: Secondary | ICD-10-CM | POA: Insufficient documentation

## 2014-10-18 DIAGNOSIS — S0990XS Unspecified injury of head, sequela: Secondary | ICD-10-CM

## 2014-10-18 DIAGNOSIS — R1013 Epigastric pain: Secondary | ICD-10-CM | POA: Insufficient documentation

## 2014-10-18 DIAGNOSIS — R0789 Other chest pain: Secondary | ICD-10-CM | POA: Diagnosis not present

## 2014-10-18 DIAGNOSIS — Z720 Tobacco use: Secondary | ICD-10-CM | POA: Diagnosis not present

## 2014-10-18 DIAGNOSIS — F319 Bipolar disorder, unspecified: Secondary | ICD-10-CM | POA: Diagnosis not present

## 2014-10-18 DIAGNOSIS — F4310 Post-traumatic stress disorder, unspecified: Secondary | ICD-10-CM | POA: Insufficient documentation

## 2014-10-18 DIAGNOSIS — F0280 Dementia in other diseases classified elsewhere without behavioral disturbance: Secondary | ICD-10-CM

## 2014-10-18 DIAGNOSIS — Z79899 Other long term (current) drug therapy: Secondary | ICD-10-CM | POA: Insufficient documentation

## 2014-10-18 DIAGNOSIS — Z792 Long term (current) use of antibiotics: Secondary | ICD-10-CM | POA: Insufficient documentation

## 2014-10-18 DIAGNOSIS — R05 Cough: Secondary | ICD-10-CM | POA: Diagnosis not present

## 2014-10-18 DIAGNOSIS — Z008 Encounter for other general examination: Secondary | ICD-10-CM | POA: Diagnosis present

## 2014-10-18 LAB — URINE DRUG SCREEN, QUALITATIVE (ARMC ONLY)
Amphetamines, Ur Screen: NOT DETECTED
Barbiturates, Ur Screen: NOT DETECTED
Benzodiazepine, Ur Scrn: POSITIVE — AB
CANNABINOID 50 NG, UR ~~LOC~~: NOT DETECTED
COCAINE METABOLITE, UR ~~LOC~~: NOT DETECTED
MDMA (ECSTASY) UR SCREEN: NOT DETECTED
Methadone Scn, Ur: NOT DETECTED
Opiate, Ur Screen: NOT DETECTED
Phencyclidine (PCP) Ur S: NOT DETECTED
Tricyclic, Ur Screen: NOT DETECTED

## 2014-10-18 LAB — CBC
HCT: 50.7 % — ABNORMAL HIGH (ref 35.0–47.0)
Hemoglobin: 16.8 g/dL — ABNORMAL HIGH (ref 12.0–16.0)
MCH: 30.5 pg (ref 26.0–34.0)
MCHC: 33.1 g/dL (ref 32.0–36.0)
MCV: 92 fL (ref 80.0–100.0)
PLATELETS: 284 10*3/uL (ref 150–440)
RBC: 5.51 MIL/uL — ABNORMAL HIGH (ref 3.80–5.20)
RDW: 13.5 % (ref 11.5–14.5)
WBC: 19.3 10*3/uL — ABNORMAL HIGH (ref 3.6–11.0)

## 2014-10-18 LAB — COMPREHENSIVE METABOLIC PANEL
ALK PHOS: 91 U/L (ref 38–126)
ALT: 23 U/L (ref 14–54)
ANION GAP: 14 (ref 5–15)
AST: 27 U/L (ref 15–41)
Albumin: 4.3 g/dL (ref 3.5–5.0)
BILIRUBIN TOTAL: 0.7 mg/dL (ref 0.3–1.2)
BUN: 6 mg/dL (ref 6–20)
CHLORIDE: 97 mmol/L — AB (ref 101–111)
CO2: 21 mmol/L — ABNORMAL LOW (ref 22–32)
Calcium: 9 mg/dL (ref 8.9–10.3)
Creatinine, Ser: 0.92 mg/dL (ref 0.44–1.00)
GFR calc non Af Amer: >60 mL/min (ref >60)
Glucose, Bld: 114 mg/dL — ABNORMAL HIGH (ref 65–99)
POTASSIUM: 3.1 mmol/L — AB (ref 3.5–5.1)
Sodium: 132 mmol/L — ABNORMAL LOW (ref 135–145)
TOTAL PROTEIN: 7.5 g/dL (ref 6.5–8.1)

## 2014-10-18 LAB — URINALYSIS COMPLETE WITH MICROSCOPIC (ARMC ONLY)
BACTERIA UA: NONE SEEN
BILIRUBIN URINE: NEGATIVE
Glucose, UA: NEGATIVE mg/dL
Hgb urine dipstick: NEGATIVE
Nitrite: NEGATIVE
PROTEIN: NEGATIVE mg/dL
Specific Gravity, Urine: 1.004 — ABNORMAL LOW (ref 1.005–1.030)
pH: 6 (ref 5.0–8.0)

## 2014-10-18 LAB — ACETAMINOPHEN LEVEL

## 2014-10-18 LAB — ETHANOL: Alcohol, Ethyl (B): <5 mg/dL (ref <5)

## 2014-10-18 LAB — LIPASE, BLOOD: Lipase: 17 U/L — ABNORMAL LOW (ref 22–51)

## 2014-10-18 LAB — SALICYLATE LEVEL

## 2014-10-18 MED ORDER — CITALOPRAM HYDROBROMIDE 20 MG PO TABS
40.0000 mg | ORAL_TABLET | Freq: Every day | ORAL | Status: DC
  Administered 2014-10-19: 40 mg via ORAL
  Filled 2014-10-18: qty 2

## 2014-10-18 MED ORDER — GABAPENTIN 300 MG PO CAPS
300.0000 mg | ORAL_CAPSULE | Freq: Three times a day (TID) | ORAL | Status: DC
  Administered 2014-10-19: 300 mg via ORAL
  Filled 2014-10-18: qty 1

## 2014-10-18 MED ORDER — TRAZODONE HCL 100 MG PO TABS: 100.0000 mg | ORAL_TABLET | Freq: Every day | ORAL | Status: DC

## 2014-10-18 MED ORDER — HYDROCHLOROTHIAZIDE 12.5 MG PO TABS
6.2500 mg | ORAL_TABLET | Freq: Once | ORAL | Status: DC
  Filled 2014-10-18: qty 1

## 2014-10-18 MED ORDER — ALBUTEROL SULFATE (2.5 MG/3ML) 0.083% IN NEBU
3.0000 mL | INHALATION_SOLUTION | Freq: Four times a day (QID) | RESPIRATORY_TRACT | Status: DC | PRN
Start: 2014-10-18 — End: 2014-10-19
  Filled 2014-10-18: qty 3

## 2014-10-18 MED ORDER — ZIPRASIDONE MESYLATE 20 MG IM SOLR: 20.0000 mg | Freq: Once | INTRAMUSCULAR | Status: DC

## 2014-10-18 MED ORDER — MOMETASONE FURO-FORMOTEROL FUM 100-5 MCG/ACT IN AERO
2.0000 | INHALATION_SPRAY | Freq: Two times a day (BID) | RESPIRATORY_TRACT | Status: DC
  Administered 2014-10-19: 2 via RESPIRATORY_TRACT
  Filled 2014-10-18: qty 8.8

## 2014-10-18 MED ORDER — IPRATROPIUM-ALBUTEROL 0.5-2.5 (3) MG/3ML IN SOLN: 3.0000 mL | RESPIRATORY_TRACT | Status: DC | PRN

## 2014-10-18 MED ORDER — HYDROCHLOROTHIAZIDE 12.5 MG PO TABS: 6.2500 mg | ORAL_TABLET | Freq: Once | ORAL | Status: DC

## 2014-10-18 MED ORDER — SIMVASTATIN 40 MG PO TABS: 40.0000 mg | ORAL_TABLET | Freq: Every day | ORAL | Status: DC

## 2014-10-18 MED ORDER — LEVETIRACETAM 500 MG PO TABS
500.0000 mg | ORAL_TABLET | Freq: Two times a day (BID) | ORAL | Status: DC
  Administered 2014-10-19: 500 mg via ORAL
  Filled 2014-10-18: qty 1

## 2014-10-18 MED ORDER — ACYCLOVIR 800 MG PO TABS
800.0000 mg | ORAL_TABLET | Freq: Every day | ORAL | Status: DC
  Administered 2014-10-19 (×2): 800 mg via ORAL
  Filled 2014-10-18 (×6): qty 1

## 2014-10-18 MED ORDER — LORAZEPAM 1 MG PO TABS
1.0000 mg | ORAL_TABLET | Freq: Once | ORAL | Status: AC
  Administered 2014-10-18: 1 mg via ORAL
  Filled 2014-10-18: qty 1

## 2014-10-18 MED ORDER — LORAZEPAM 2 MG/ML IJ SOLN: 2.0000 mg | Freq: Once | INTRAMUSCULAR | Status: DC

## 2014-10-18 MED ORDER — FLUTICASONE PROPIONATE 50 MCG/ACT NA SUSP
2.0000 | Freq: Every day | NASAL | Status: DC
  Administered 2014-10-19: 2 via NASAL
  Filled 2014-10-18 (×2): qty 16

## 2014-10-18 MED ORDER — ALPRAZOLAM 0.5 MG PO TABS
0.5000 mg | ORAL_TABLET | Freq: Two times a day (BID) | ORAL | Status: DC
  Administered 2014-10-19: 0.5 mg via ORAL
  Filled 2014-10-18: qty 1

## 2014-10-18 NOTE — ED Notes (Signed)

## 2014-10-18 NOTE — ED Notes (Signed)
Patient resting comfortably in bed, respirations even and unlabored. Patient aware to contract for safety. NAD noted.

## 2014-10-18 NOTE — ED Notes (Signed)
Patient resting comfortably in bed, respirations even and unlabored. Patient aware to contract for safety. NAD noted at this time. 

## 2014-10-18 NOTE — ED Notes (Signed)
ENVIRONMENTAL ASSESSMENT  Potentially harmful objects out of patient reach: Yes.  Personal belongings secured: Yes.  Patient dressed in hospital provided attire only: Yes.  Plastic bags out of patient reach: Yes.  Patient care equipment (cords, cables, call bells, lines, and drains) shortened, removed, or accounted for: Yes.  Equipment and supplies removed from bottom of stretcher: Yes.  Potentially toxic materials out of patient reach: Yes.  Sharps container removed or out of patient reach: Yes.   BEHAVIORAL HEALTH ROUNDING  Patient sleeping: No.  Patient alert and oriented: Yes. Behavior appropriate: No. ; If no, describe: anxious, agitated, tearful Nutrition and fluids offered: Yes.  Toileting and hygiene offered: Yes.  Sitter present: Yes.  Law enforcement present: Yes.

## 2014-10-18 NOTE — ED Notes (Addendum)

## 2014-10-18 NOTE — ED Notes (Signed)

## 2014-10-18 NOTE — ED Notes (Signed)
Patient resting with eyes closed, respirations even and unlabored. NAD at this time. Sitter and ODS officer present. Will continue to monitor. 
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
## 2014-10-18 NOTE — ED Notes (Signed)

## 2014-10-18 NOTE — BH Assessment (Signed)
Assessment Note  Sherri Foster is an 54 y.o. female presenting to the EDunder involuntary commitment from [name] with [facility], due to hearing voices and paranoia and disorganized thought. Pt reports that "people have the law on me and they are going too far with their revenge".   Patient denies any suicidal/homicidal ideations or visual hallucinations.  Pt denies any drug/alcohol use.  Axis I: Anxiety Disorder NOS and Depressive Disorder NOS Axis II: Deferred Axis III:  Past Medical History  Diagnosis Date  . Depression   . Opiate use     long term use of opiate analgesics.  . Hepatitis C   . Back pain   . Seizures   . COPD (chronic obstructive pulmonary disease)   . Bipolar 1 disorder   . Hypertension   . Allergy   . Arthritis   . Blood transfusion without reported diagnosis   . Hyperlipidemia    Axis IV: problems with primary support group Axis V: 61-70 mild symptoms  Past Medical History:  Past Medical History  Diagnosis Date  . Depression   . Opiate use     long term use of opiate analgesics.  . Hepatitis C   . Back pain   . Seizures   . COPD (chronic obstructive pulmonary disease)   . Bipolar 1 disorder   . Hypertension   . Allergy   . Arthritis   . Blood transfusion without reported diagnosis   . Hyperlipidemia     Past Surgical History  Procedure Laterality Date  . Abdominal hysterectomy      Family History:  Family History  Problem Relation Age of Onset  . Arthritis Mother   . Diabetes Mother   . Hyperlipidemia Mother   . Hypertension Mother   . Miscarriages / [place] Mother     Social History:  reports that she has been smoking Cigarettes.  She has been smoking about 1.00 pack per day. She does not have any smokeless tobacco history on file. She reports that she does not drink alcohol or use illicit drugs.  Additional Social History:  Alcohol / Drug Use History of alcohol / drug use?: No history of alcohol / drug abuse  CIWA:  CIWA-Ar BP: 138/84 mmHg Pulse Rate: 99 COWS:    Allergies:  Allergies  Allergen Reactions  . Amitriptyline Anaphylaxis  . Potassium-Containing Compounds Shortness Of Breath  . Topical Sulfur Other (See Comments)    Reaction:  Unknown   . Topiramate Nausea And Vomiting  . Aspirin Rash  . Codeine Rash  . Tylenol [Acetaminophen] Rash    Home Medications:  (Not in a hospital admission)  OB/GYN Status:  No LMP recorded. Patient has had a hysterectomy.  General Assessment Data Location of Assessment: [facility] ED TTS Assessment: In system Is this a Tele or Face-to-Face Assessment?: Face-to-Face Is this an Initial Assessment or a Re-assessment for this encounter?: Initial Assessment Marital status: Single Maiden name: Cochrane Is patient pregnant?: No Pregnancy Status: No Living Arrangements: Alone Can pt return to current living arrangement?: Yes Admission Status: Involuntary Is patient capable of signing voluntary admission?: No Referral Source: Self/Family/Friend Insurance type: Medicare  Medical Screening Exam [facility] Walk-in ONLY) Medical Exam completed: Yes  Crisis Care Plan Living Arrangements: Alone Name of Psychiatrist: [name] Name of Therapist: [name]  Education Status Is patient currently in school?: No Current Grade: N/A Highest grade of school patient has completed: 12th Contact person: N/A  Risk to self with the past 6 months Suicidal Ideation:  No Has patient been a risk to self within the past 6 months prior to admission? : No Suicidal Intent: No Has patient had any suicidal intent within the past 6 months prior to admission? : No Is patient at risk for suicide?: No Suicidal Plan?: No Has patient had any suicidal plan within the past 6 months prior to admission? : No Access to Means: No What has been your use of drugs/alcohol within the last 12 months?: N/A Previous Attempts/Gestures: No How many times?: 0 Other Self Harm Risks: N/A Triggers  for Past Attempts: Family contact Intentional Self Injurious Behavior: None Family Suicide History: No Recent stressful life event(s): Conflict (Comment) (Conflict with family) Persecutory voices/beliefs?: No Depression: Yes Depression Symptoms: Despondent, Feeling worthless/self pity Substance abuse history and/or treatment for substance abuse?: No Suicide prevention information given to non-admitted patients: Not applicable  Risk to Others within the past 6 months Homicidal Ideation: No Does patient have any lifetime risk of violence toward others beyond the six months prior to admission? : No Thoughts of Harm to Others: No Current Homicidal Intent: No Current Homicidal Plan: No Access to Homicidal Means: No Identified Victim: N/A History of harm to others?: No Assessment of Violence: None Noted Violent Behavior Description: N/A Does patient have access to weapons?: No Criminal Charges Pending?: No Does patient have a court date: No Is patient on probation?: No  Psychosis Hallucinations: Auditory Delusions: None noted  Mental Status Report Appearance/Hygiene: In scrubs Eye Contact: Good Motor Activity: Agitation Speech: Unremarkable Level of Consciousness: Alert Mood: Anxious, Apprehensive Affect: Anxious, Depressed Anxiety Level: Moderate Judgement: Partial Orientation: Person, Place, Time, Situation Obsessive Compulsive Thoughts/Behaviors: None  Cognitive Functioning Concentration: Normal Memory: Recent Intact IQ: Average Insight: Fair Impulse Control: Fair Appetite: Good Weight Loss: 0 Weight Gain: 0 Sleep: No Change Total Hours of Sleep: 6 Vegetative Symptoms: None  ADLScreening [facility] Assessment Services) Patient's cognitive ability adequate to safely complete daily activities?: Yes Patient able to express need for assistance with ADLs?: Yes Independently performs ADLs?: Yes (appropriate for developmental age)  Prior Inpatient Therapy Prior Inpatient  Therapy: No  Prior Outpatient Therapy Prior Outpatient Therapy: Yes Prior Therapy Dates: 10/18/2014 Prior Therapy Facilty/Provider(s): [facility] Reason for Treatment: anxiety Does patient have an ACCT team?: No Does patient have Intensive In-House Services?  : No Does patient have [facility] services? : No Does patient have P4CC services?: No  ADL Screening (condition at time of admission) Patient's cognitive ability adequate to safely complete daily activities?: Yes Patient able to express need for assistance with ADLs?: Yes Independently performs ADLs?: Yes (appropriate for developmental age)       Abuse/Neglect Assessment (Assessment to be complete while patient is alone) Physical Abuse: Denies Verbal Abuse: Denies Sexual Abuse: Denies Exploitation of patient/patient's resources: Denies Self-Neglect: Denies Values / Beliefs Cultural Requests During Hospitalization: None Spiritual Requests During Hospitalization: None Consults Spiritual Care Consult Needed: No Social Work Consult Needed: No Merchant navy officer (For Healthcare) Does patient have an advance directive?: No Would patient like information on creating an advanced directive?: No - patient declined information    Additional Information 1:1 In Past 12 Months?: No CIRT Risk: No Elopement Risk: No     Disposition:  Disposition Initial Assessment Completed for this Encounter: Yes Disposition of Patient: Other dispositions (Psych MD Consult) Other disposition(s): Other (Comment) (Psych MD Cosuult)  On Site Evaluation by:   Reviewed with Physician:    [place] 10/18/2014 9:49 PM

## 2014-10-19 ENCOUNTER — Encounter: Payer: Medicare Other | Admitting: Pain Medicine

## 2014-10-19 DIAGNOSIS — F4310 Post-traumatic stress disorder, unspecified: Secondary | ICD-10-CM | POA: Diagnosis not present

## 2014-10-19 DIAGNOSIS — F0280 Dementia in other diseases classified elsewhere without behavioral disturbance: Secondary | ICD-10-CM

## 2014-10-19 DIAGNOSIS — S0990XS Unspecified injury of head, sequela: Secondary | ICD-10-CM

## 2014-10-19 NOTE — ED Notes (Signed)
Consult completed - she will be discharged to home

## 2014-10-19 NOTE — ED Notes (Signed)
Lunch provided along with an extra drink   Appropriate to stimulation  No verbalized needs or concerns at this time  NAD assessed  Continue to monitor 

## 2014-10-19 NOTE — Discharge Instructions (Signed)
You have been seen in the Emergency Department (ED) today for a psychiatric complaint.  You have been evaluated by psychiatry and we believe you are safe to be discharged from the hospital.    Please return to the ED immediately if you have ANY thoughts of hurting yourself or anyone else, so that we may help you.  Please avoid alcohol and drug use.  Follow up with your doctor and/or therapist as soon as possible regarding today's ED visit.   Please follow up any other recommendations and clinic appointments provided by the psychiatry team that saw you in the Emergency Department.   Posttraumatic Stress Disorder Posttraumatic stress disorder (PTSD) is a mental disorder. It occurs after a traumatic event in your life. The traumatic events that cause PTSD are outside the range of normal human experience. Examples of these events include war, automobile accidents, natural disasters, rape, domestic violence, and violent crimes. Most people who experience these types of events are able to heal on their own. Those who do not heal develop PTSD. PTSD can happen to anyone at any age. However, people with a history of childhood abuse are at increased risk for developing PTSD.  SYMPTOMS  The traumatic event that causes PTSD must be a threat to life, cause serious injury, or involve sexual violence. The traumatic event is usually experienced directly by the person who develops PTSD. Sometimes PTSD occurs in people who witness traumas that occur to others or who hear about a trauma that occurs to a close family member or friend. The following behaviors are characteristic of people with PTSD:  People with PTSD re-experience the traumatic event in one or more of the following ways (intrusion symptoms):  Recurrent, unwanted distressing memories while awake.  Recurrent distressing dreams.  Sensations similar to those felt when the event originally occurred (flashbacks).   Intense or prolonged emotional  distress, triggered by reminders of the trauma. This may include fear, horror, intense sadness, or anger.  Marked physical reactions, triggered by reminders of the trauma. This may include racing heart, shortness of breath, sweating, and shaking.  People with PTSD avoid thoughts, conversations, people, or activities that remind them of the traumatic event (avoidance symptoms).  People with PTSD have negative changes in their thinking and mood after the traumatic event. These changes include:  Inability to remember one or more significant aspects of the traumatic event (memory gaps).  Exaggerated negative perceptions about themselves or others, such as believing that they are bad people or that no one can be trusted.  Unrealistic assignment of blame to themselves or others for the traumatic event.  Persistent negative emotional state, such as fear, horror, anger, sadness, guilt, or shame.  Markedly decreased interest or participation in significant activities.  A loss of connection with other people.  Inability to experience positive emotions, such as happiness or love.  People with PTSD are more sensitive to their environment and react more easily than others (hyperarousal-overreactivity symptoms). These symptoms include:  Irritability, with angry outbursts toward other people or objects. The outbursts are easily triggered and may be verbal or physical.  Careless or self-destructive behavior. This may include reckless driving or drug use.  A feeling of being on edge, with increased alertness (hypervigilance).  Exaggerated reactions to stimuli, such as being easily startled.   Difficulty concentrating.  Difficulty sleeping. PTSD symptoms may start soon after a frightening event or months or years later. They last at least 1 month or longer and can affect one or more  areas of functioning, such as social or occupational functioning.  DIAGNOSIS  PTSD is diagnosed through an  assessment by a mental health professional. Sherri Foster will be asked questions about the traumatic events in your life. You will also be asked about how these events have changed your thoughts, mood, behavior, and ability to function on a daily basis. You may be asked about your use of alcohol or drugs, which can make PTSD symptoms worse. TREATMENT  Unlike many mental disorders, which require lifelong management, PTSD is a curable condition. The goal of PTSD treatment is to neutralize the negative effects of the traumatic event on daily functioning, not erase the memory of the event. The following treatments may be prescribed to reach this goal:  Medicines. Certain medicines can reduce some PTSD symptoms. Intrusion symptoms and hyperarousal-overactivity symptoms respond best to medicines.  Counseling (talk therapy). Talk therapy with a mental health professional who is experienced in treating PTSD can help. Talk therapy can provide education, emotional support, and coping skills. Certain types of talk therapy that specifically target the traumatic events are the most effective treatment for PTSD:  Prolonged exposure therapy, which involves remembering and processing the traumatic event with a therapist in a safe environment until it no longer creates a negative emotional response.  Eye movement desensitization and reprocessing therapy, which involves the use of repetitive physical stimulation of the senses that alternates between the right and left sides of the body. It is believed that this therapy facilitates communication between the two sides of the brain. This communication helps the mind to integrate the fragmented memories of the traumatic event into a whole story that makes sense and no longer creates a negative emotional response. Most people with PTSD benefit from a combination of these treatments.  Document Released: 11/19/2000 Document Revised: 07/11/2013 Document Reviewed: 05/13/2012 [facility]  Patient Information 2015 [place], [place]. This information is not intended to replace advice given to you by your health care provider. Make sure you discuss any questions you have with your health care provider.

## 2014-10-19 NOTE — ED Provider Notes (Signed)
-----------------------------------------   6:59 AM on 10/19/2014 -----------------------------------------   Blood pressure 138/84, pulse 99, temperature 97.8 F (36.6 C), temperature source Oral, resp. rate 18, height 5' (1.524 m), weight 160 lb (72.576 kg), SpO2 95 %.  The patient had no acute events since last update.  Calm and cooperative at this time.  Disposition is pending per Psychiatry/Behavioral Medicine team recommendations.     Irean Hong, MD 10/19/14 [phone]

## 2014-10-19 NOTE — ED Notes (Signed)
BEHAVIORAL HEALTH ROUNDING Patient sleeping: No. Patient alert and oriented: yes Behavior appropriate: Yes.  ; If no, describe:  Nutrition and fluids offered: Yes  Toileting and hygiene offered: Yes  Sitter present: not applicable Law enforcement present: Yes  

## 2014-10-19 NOTE — ED Notes (Signed)
Shower completed  - pt has returned all items back to the ns door  Pt transferred into ED BHU room 2   Patient assigned to appropriate care area. Patient oriented to unit/care area: Informed that, for their safety, care areas are designed for safety and monitored by security cameras at all times; Visiting hours and phone times explained to patient. Patient verbalizes understanding, and verbal contract for safety obtained.

## 2014-10-19 NOTE — Consult Note (Signed)
[facility] Face-to-Face Psychiatry Consult   Reason for Consult:  Consult for this 54 year old woman who came into the hospital from [place]. Confused and paranoid at the time. Sherri Foster Referring Physician: Joni Foster Patient Identification: Sherri Foster MRN:  [id number] Principal Diagnosis: PTSD (post-traumatic stress disorder) Diagnosis:   Patient Active Problem List   Diagnosis Date Noted  . PTSD (post-traumatic stress disorder) [F43.10] 10/19/2014  . 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 DDD (degenerative disc disease), lumbar [M51.36] 07/20/2014  . 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 Bilateral occipital neuralgia [M54.81] 07/20/2014  . Facet syndrome, lumbar [M54.5] 07/20/2014    Total Time spent with patient: 1 hour  Subjective:   Sherri Foster is a 54 y.o. female patient admitted with "those people are playing head games".  HPI:  Information from the patient and the chart. Patient presented to [place] yesterday and at that time was apparently very disorganized. Talking about how she was hearing people talk about her and people were trying to harm her in some paranoid sort of way. When she presented to the emergency room she talked repeatedly about how things were getting on her nerves II badly. She was disorganized in her speech and at a hard time trying to explain her situation. Affect was labile. She indicated that she had recently moved into a new [place] and it sounds like she's had a hard time adjusting. Hadn't been sleeping very well. Had felt like other people there were trying to harm her. She denied any suicidal or homicidal ideation and indicated that she was taking her medicine normally.  Past psychiatric history: Patient indicates that she has a follow-up at [facility] and has been apparently diagnosed with generalized anxiety disorder and PTSD. She also indicates that she had a distant history of a head injury which has resulted in chronic  problems with memory and concentration. She doesn't have very much as far as old psychiatric history here and says that most of her psychiatric care was at [facility]. Most of her care at our hospital has been for her chronic pain. Not reporting any history of suicide attempts.  Social history: Patient just recently moved into a boarding house. Has had a hard time adjusting. Sounds like she is somewhat estranged from most of her family. She does get a disability check however. Says that she usually manages her own money.  Medical history: Chronic pain from degenerative disc disease in the back and hips. So claims to have a history of a head injury with some chronic dementia. High blood pressure.  Family history: Nonidentified  Substance abuse history: Denies that she's been abusing any substances recently. HPI Elements:   Quality:  Confusion and paranoia and disorganized thinking. Severity:  Moderate to severe. Timing:  Seems to of been building up for several days. Duration:  Now resolved as of today. Context:  Stress from recently moving to a new situation.  Past Medical History:  Past Medical History  Diagnosis Date  . Depression   . Opiate use     long term use of opiate analgesics.  . Hepatitis C   . Back pain   . Seizures   . COPD (chronic obstructive pulmonary disease)   . Bipolar 1 disorder   . Hypertension   . Allergy   . Arthritis   . Blood transfusion without reported diagnosis   . Hyperlipidemia     Past Surgical History  Procedure Laterality Date  . Abdominal hysterectomy  Family History:  Family History  Problem Relation Age of Onset  . Arthritis Mother   . Diabetes Mother   . Hyperlipidemia Mother   . Hypertension Mother   . Miscarriages / [place] Mother    Social History:  History  Alcohol Use No     History  Drug Use No    Social History   Social History  . Marital Status: Married    Spouse Name: N/A  . Number of Children: N/A  . Years of  Education: N/A   Social History Main Topics  . Smoking status: Current Every Day Smoker -- 1.00 packs/day    Types: Cigarettes    Last Attempt to Quit: 06/26/2014  . Smokeless tobacco: None  . Alcohol Use: No  . Drug Use: No  . Sexual Activity: No   Other Topics Concern  . None   Social History Narrative   Additional Social History:    History of alcohol / drug use?: No history of alcohol / drug abuse                     Allergies:   Allergies  Allergen Reactions  . Amitriptyline Anaphylaxis  . Potassium-Containing Compounds Shortness Of Breath  . Topical Sulfur Other (See Comments)    Reaction:  Unknown   . Topiramate Nausea And Vomiting  . Aspirin Rash  . Codeine Rash  . Tylenol [Acetaminophen] Rash    Labs:  Results for orders placed or performed during the hospital encounter of 10/18/14 (from the past 48 hour(s))  Comprehensive metabolic panel     Status: Abnormal   Collection Time: 10/18/14  5:15 PM  Result Value Ref Range   Sodium 132 (L) 135 - 145 mmol/L   Potassium 3.1 (L) 3.5 - 5.1 mmol/L   Chloride 97 (L) 101 - 111 mmol/L   CO2 21 (L) 22 - 32 mmol/L   Glucose, Bld 114 (H) 65 - 99 mg/dL   BUN 6 6 - 20 mg/dL   Creatinine, Ser 0.92 0.44 - 1.00 mg/dL   Calcium 9.0 8.9 - 10.3 mg/dL   Total Protein 7.5 6.5 - 8.1 g/dL   Albumin 4.3 3.5 - 5.0 g/dL   AST 27 15 - 41 U/L   ALT 23 14 - 54 U/L   Alkaline Phosphatase 91 38 - 126 U/L   Total Bilirubin 0.7 0.3 - 1.2 mg/dL   GFR calc non Af Amer >60 >60 mL/min   GFR calc Af Amer >60 >60 mL/min    Comment: (NOTE) The eGFR has been calculated using the CKD EPI equation. This calculation has not been validated in all clinical situations. eGFR's persistently <60 mL/min signify possible Chronic Kidney Disease.    Anion gap 14 5 - 15  Ethanol (ETOH)     Status: None   Collection Time: 10/18/14  5:15 PM  Result Value Ref Range   Alcohol, Ethyl (B) <5 <5 mg/dL    Comment:        LOWEST DETECTABLE LIMIT  FOR SERUM ALCOHOL IS 5 mg/dL FOR MEDICAL PURPOSES ONLY   Salicylate level     Status: None   Collection Time: 10/18/14  5:15 PM  Result Value Ref Range   Salicylate Lvl <4.5 2.8 - 30.0 mg/dL  Acetaminophen level     Status: Abnormal   Collection Time: 10/18/14  5:15 PM  Result Value Ref Range   Acetaminophen (Tylenol), Serum <10 (L) 10 - 30 ug/mL    Comment:  THERAPEUTIC CONCENTRATIONS VARY SIGNIFICANTLY. A RANGE OF 10-30 ug/mL MAY BE AN EFFECTIVE CONCENTRATION FOR MANY PATIENTS. HOWEVER, SOME ARE BEST TREATED AT CONCENTRATIONS OUTSIDE THIS RANGE. ACETAMINOPHEN CONCENTRATIONS >150 ug/mL AT 4 HOURS AFTER INGESTION AND >50 ug/mL AT 12 HOURS AFTER INGESTION ARE OFTEN ASSOCIATED WITH TOXIC REACTIONS.   CBC     Status: Abnormal   Collection Time: 10/18/14  5:15 PM  Result Value Ref Range   WBC 19.3 (H) 3.6 - 11.0 K/uL   RBC 5.51 (H) 3.80 - 5.20 MIL/uL   Hemoglobin 16.8 (H) 12.0 - 16.0 g/dL   HCT 50.7 (H) 35.0 - 47.0 %   MCV 92.0 80.0 - 100.0 fL   MCH 30.5 26.0 - 34.0 pg   MCHC 33.1 32.0 - 36.0 g/dL   RDW 13.5 11.5 - 14.5 %   Platelets 284 150 - 440 K/uL  Lipase, blood     Status: Abnormal   Collection Time: 10/18/14  5:15 PM  Result Value Ref Range   Lipase 17 (L) 22 - 51 U/L  Urine Drug Screen, Qualitative ([facility] only)     Status: Abnormal   Collection Time: 10/18/14  6:09 PM  Result Value Ref Range   Tricyclic, Ur Screen NONE DETECTED NONE DETECTED   Amphetamines, Ur Screen NONE DETECTED NONE DETECTED   MDMA (Ecstasy)Ur Screen NONE DETECTED NONE DETECTED   Cocaine Metabolite,Ur [place] NONE DETECTED NONE DETECTED   Opiate, Ur Screen NONE DETECTED NONE DETECTED   Phencyclidine (PCP) Ur S NONE DETECTED NONE DETECTED   Cannabinoid 50 Ng, Ur [place] NONE DETECTED NONE DETECTED   Barbiturates, Ur Screen NONE DETECTED NONE DETECTED   Benzodiazepine, Ur Scrn POSITIVE (A) NONE DETECTED   Methadone Scn, Ur NONE DETECTED NONE DETECTED    Comment: (NOTE) 294  Tricyclics, urine                Cutoff 1000 ng/mL 200  Amphetamines, urine             Cutoff 1000 ng/mL 300  MDMA (Ecstasy), urine           Cutoff 500 ng/mL 400  Cocaine Metabolite, urine       Cutoff 300 ng/mL 500  Opiate, urine                   Cutoff 300 ng/mL 600  Phencyclidine (PCP), urine      Cutoff 25 ng/mL 700  Cannabinoid, urine              Cutoff 50 ng/mL 800  Barbiturates, urine             Cutoff 200 ng/mL 900  Benzodiazepine, urine           Cutoff 200 ng/mL 1000 Methadone, urine                Cutoff 300 ng/mL 1100 1200 The urine drug screen provides only a preliminary, unconfirmed 1300 analytical test result and should not be used for non-medical 1400 purposes. Clinical consideration and professional judgment should 1500 be applied to any positive drug screen result due to possible 1600 interfering substances. A more specific alternate chemical method 1700 must be used in order to obtain a confirmed analytical result.  1800 Gas chromato graphy / mass spectrometry (GC/MS) is the preferred 1900 confirmatory method.   Urinalysis complete, with microscopic     Status: Abnormal   Collection Time: 10/18/14  6:09 PM  Result Value Ref Range   Color, Urine STRAW (A)  YELLOW   APPearance CLEAR (A) CLEAR   Glucose, UA NEGATIVE NEGATIVE mg/dL   Bilirubin Urine NEGATIVE NEGATIVE   Ketones, ur TRACE (A) NEGATIVE mg/dL   Specific Gravity, Urine 1.004 (L) 1.005 - 1.030   Hgb urine dipstick NEGATIVE NEGATIVE   pH 6.0 5.0 - 8.0   Protein, ur NEGATIVE NEGATIVE mg/dL   Nitrite NEGATIVE NEGATIVE   Leukocytes, UA 2+ (A) NEGATIVE   RBC / HPF 0-5 0 - 5 RBC/hpf   WBC, UA 6-30 0 - 5 WBC/hpf   Bacteria, UA NONE SEEN NONE SEEN   Squamous Epithelial / LPF 0-5 (A) NONE SEEN    Vitals: Blood pressure 126/80, pulse 96, temperature 98 F (36.7 C), temperature source Oral, resp. rate 20, height 5' (1.524 m), weight 72.576 kg (160 lb), SpO2 95 %.  Risk to Self: Suicidal Ideation: No Suicidal Intent: No Is  patient at risk for suicide?: No Suicidal Plan?: No Access to Means: No What has been your use of drugs/alcohol within the last 12 months?: N/A How many times?: 0 Other Self Harm Risks: N/A Triggers for Past Attempts: Family contact Intentional Self Injurious Behavior: None Risk to Others: Homicidal Ideation: No Thoughts of Harm to Others: No Current Homicidal Intent: No Current Homicidal Plan: No Access to Homicidal Means: No Identified Victim: N/A History of harm to others?: No Assessment of Violence: None Noted Violent Behavior Description: N/A Does patient have access to weapons?: No Criminal Charges Pending?: No Does patient have a court date: No Prior Inpatient Therapy: Prior Inpatient Therapy: No Prior Outpatient Therapy: Prior Outpatient Therapy: Yes Prior Therapy Dates: 10/18/2014 Prior Therapy Facilty/Provider(s): [facility] Reason for Treatment: anxiety Does patient have an ACCT team?: No Does patient have Intensive In-House Services?  : No Does patient have [facility] services? : No Does patient have P4CC services?: No  Current Facility-Administered Medications  Medication Dose Route Frequency Provider Last Rate Last Dose  . acyclovir (ZOVIRAX) tablet 800 mg  800 mg Oral 5 X Daily Carrie Mew, MD   800 mg at 10/19/14 1057  . albuterol (PROVENTIL) (2.5 MG/3ML) 0.083% nebulizer solution 3 mL  3 mL Inhalation Q6H PRN Carrie Mew, MD      . ALPRAZolam Duanne Moron) tablet 0.5 mg  0.5 mg Oral BID Carrie Mew, MD   0.5 mg at 10/19/14 1058  . citalopram (CELEXA) tablet 40 mg  40 mg Oral Daily Carrie Mew, MD   40 mg at 10/19/14 1058  . fluticasone (FLONASE) 50 MCG/ACT nasal spray 2 spray  2 spray Each Nare Daily Carrie Mew, MD   2 spray at 10/19/14 1101  . gabapentin (NEURONTIN) capsule 300 mg  300 mg Oral TID Carrie Mew, MD   300 mg at 10/19/14 1059  . hydrochlorothiazide (HYDRODIURIL) tablet 6.25 mg  6.25 mg Oral Once Carrie Mew, MD   6.25 mg at  10/19/14 1102  . ipratropium-albuterol (DUONEB) 0.5-2.5 (3) MG/3ML nebulizer solution 3 mL  3 mL Nebulization Q4H PRN Carrie Mew, MD      . levETIRAcetam (KEPPRA) tablet 500 mg  500 mg Oral BID Carrie Mew, MD   500 mg at 10/19/14 1059  . mometasone-formoterol (DULERA) 100-5 MCG/ACT inhaler 2 puff  2 puff Inhalation BID Carrie Mew, MD   2 puff at 10/19/14 [phone]  . simvastatin (ZOCOR) tablet 40 mg  40 mg Oral QHS Carrie Mew, MD      . traZODone (DESYREL) tablet 100 mg  100 mg Oral QHS Carrie Mew, MD  Current Outpatient Prescriptions  Medication Sig Dispense Refill  . acyclovir (ZOVIRAX) 800 MG tablet Take 800 mg by mouth 5 (five) times daily.    Marland Kitchen albuterol (PROVENTIL HFA;VENTOLIN HFA) 108 (90 BASE) MCG/ACT inhaler Inhale 2 puffs into the lungs every 6 (six) hours as needed for wheezing or shortness of breath.    . ALPRAZolam (XANAX) 0.5 MG tablet Take 0.5 mg by mouth 2 (two) times daily.    . citalopram (CELEXA) 40 MG tablet Take 40 mg by mouth daily.    Marland Kitchen EPINEPHrine 0.3 mg/0.3 mL IJ SOAJ injection Inject 0.3 mg into the muscle once as needed (for severe allergic reaction).     . fluticasone (FLONASE) 50 MCG/ACT nasal spray Place 2 sprays into both nostrils daily.    . Fluticasone-Salmeterol (ADVAIR) 250-50 MCG/DOSE AEPB Inhale 1 puff into the lungs every 12 (twelve) hours.    . gabapentin (NEURONTIN) 300 MG capsule Take 300 mg by mouth 3 (three) times daily.    . hydrochlorothiazide (HYDRODIURIL) 12.5 MG tablet Take 6.25 mg by mouth daily.     Marland Kitchen ipratropium-albuterol (DUONEB) 0.5-2.5 (3) MG/3ML SOLN Take 3 mLs by nebulization every 4 (four) hours as needed (for shortness of breath).     . levETIRAcetam (KEPPRA) 500 MG tablet Take 500 mg by mouth 2 (two) times daily.    Marland Kitchen oxyCODONE (ROXICODONE) 5 MG immediate release tablet Limit 1 tab by mouth 2-4 times per day if tolerated (Patient taking differently: Take 5 mg by mouth 4 (four) times daily as needed for severe  pain. ) 120 tablet 0  . simvastatin (ZOCOR) 40 MG tablet Take 40 mg by mouth at bedtime.     . traZODone (DESYREL) 100 MG tablet Take 100 mg by mouth at bedtime.      Musculoskeletal: Strength & Muscle Tone: within normal limits Gait & Station: normal Patient leans: N/A  Psychiatric Specialty Exam: Physical Exam  Constitutional: She appears well-developed and well-nourished.  HENT:  Head: Normocephalic and atraumatic.  Mouth/Throat:    Eyes: Conjunctivae are normal. Pupils are equal, round, and reactive to light.  Neck: Normal range of motion.  Cardiovascular: Normal heart sounds.   Respiratory: Effort normal.  GI: Soft.  Musculoskeletal: Normal range of motion.  Neurological: She is alert.  Skin: Skin is warm and dry.     Psychiatric: Her behavior is normal. Her affect is blunt. Her speech is tangential. Thought content is paranoid. Cognition and memory are impaired. She expresses impulsivity. She exhibits abnormal recent memory and abnormal remote memory.  Patient presents as more lucid than she was yesterday but still pretty tangential in her thinking. She appears to be oriented however to the current situation and there is no indication of any acute dangerousness.    Review of Systems  Constitutional: Negative.   HENT: Negative.   Eyes: Negative.   Respiratory: Negative.   Cardiovascular: Negative.   Gastrointestinal: Negative.   Musculoskeletal: Negative.   Skin: Negative.   Neurological: Negative.   Psychiatric/Behavioral: Positive for memory loss. Negative for depression, suicidal ideas, hallucinations and substance abuse. The patient is nervous/anxious. The patient does not have insomnia.     Blood pressure 126/80, pulse 96, temperature 98 F (36.7 C), temperature source Oral, resp. rate 20, height 5' (1.524 m), weight 72.576 kg (160 lb), SpO2 95 %.Body mass index is 31.25 kg/(m^2).  General Appearance: Disheveled  Eye Contact::  Minimal  Speech:  Garbled    Volume:  Normal  Mood:  Euthymic  Affect:  Constricted  Thought Process:  Tangential  Orientation:  Full (Time, Place, and Person)  Thought Content:  Paranoid Ideation  Suicidal Thoughts:  No  Homicidal Thoughts:  No  Memory:  Immediate;   Poor Recent;   Poor Remote;   Poor  Judgement:  Fair  Insight:  Fair  Psychomotor Activity:  Decreased  Concentration:  Fair  Recall:  [employer] of Knowledge:Poor  Language: Poor  Akathisia:  No  Handed:  Right  AIMS (if indicated):     Assets:  Communication Skills Desire for Improvement Financial Resources/Insurance  ADL's:  Intact  Cognition: Impaired,  Mild  Sleep:      Medical Decision Making: New problem, with additional work up planned, Review or order clinical lab tests (1), Review and summation of old records (2) and Review of Medication Regimen & Side Effects (2)  Treatment Plan Summary: Plan Patient was not started on any new psychiatric medicine but was continued on her usual outpatient medicines overnight. The worst of her symptoms appear to resolve. She still has some vague paranoia but there is no sign of any threatening or suicidal ideation. She no longer appears to be a danger to herself and is requesting discharge. She has outpatient treatment or D in place at [facility]. 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 No new prescriptions written. Follow up in the community. Discontinue IVC case discussed with emergency room physician.  Plan:  Patient does not meet criteria for psychiatric inpatient admission. Discussed crisis plan, support from social network, calling 911, coming to the Emergency Department, and calling Suicide Hotline. Disposition: Discharge is noted above  Veverly Larimer 10/19/2014 4:45 PM

## 2014-10-19 NOTE — ED Notes (Signed)

## 2014-10-19 NOTE — ED Notes (Signed)
ED BHU PLACEMENT JUSTIFICATION Is the patient under IVC or is there intent for IVC: Yes.   Is the patient medically cleared: Yes.   Is there vacancy in the ED BHU: Yes.   Is the population mix appropriate for patient: Yes.   Is the patient awaiting placement in inpatient or outpatient setting: No. Has the patient had a psychiatric consult: pending Survey of unit performed for contraband, proper placement and condition of furniture, tampering with fixtures in bathroom, shower, and each patient room: Yes.  ; Findings:  APPEARANCE/BEHAVIOR Calm and cooperative NEURO ASSESSMENT Orientation: oriented x3  Denies pain Hallucinations: No.None noted (Hallucinations) Speech: Normal Gait: normal RESPIRATORY ASSESSMENT even unlabored respirations  CARDIOVASCULAR ASSESSMENT Pulses equal  regular rate  Skin warm and dry GASTROINTESTINAL ASSESSMENT no GI complaint EXTREMITIES Full ROM PLAN OF CARE Provide calm/safe environment. Vital signs assessed twice daily. ED BHU Assessment once each 12-hour shift. Collaborate with intake RN daily or as condition indicates. Assure the ED provider has rounded once each shift. Provide and encourage hygiene. Provide redirection as needed. Assess for escalating behavior; address immediately and inform ED provider.  Assess family dynamic and appropriateness for visitation as needed: Yes.  ; If necessary, describe findings:  Educate the patient/family about BHU procedures/visitation: Yes.  ; If necessary, describe findings:   

## 2014-10-19 NOTE — ED Notes (Signed)
Patient resting with eyes closed, respirations even and unlabored. NAD at this time. Sitter and ODS officer present. Will continue to monitor. 

## 2014-10-19 NOTE — ED Notes (Signed)
BEHAVIORAL HEALTH ROUNDING Patient sleeping: No. Patient alert and oriented: yes Behavior appropriate: Yes.  ; If no, describe:  Nutrition and fluids offered: yes Toileting and hygiene offered: Yes  Sitter present: q15 minute observations and security camera monitoring Law enforcement present: Yes  ODS  

## 2014-10-19 NOTE — ED Notes (Signed)

## 2014-10-19 NOTE — ED Notes (Cosign Needed Addendum)
Pt is currently in the shower / she is going to transfer into bhu room 2

## 2014-10-19 NOTE — ED Notes (Signed)
Am meds administered as ordered   Pt observed with no unusual behavior  Appropriate to stimulation  No verbalized needs or concerns at this time  NAD assessed  Continue to monitor 

## 2014-10-19 NOTE — ED Notes (Signed)
Pt observed sitting up in bed   Appropriate to stimulation  No verbalized needs or concerns at this time  NAD assessed   Psych consult pending   Continue to monitor

## 2014-10-19 NOTE — ED Notes (Signed)
2/2 bags of belongings returned to her and she verbalized that she received back all belongings that she came here with    Discharge instructions reviewed and she verbalized agreement and understanding  F/u at [facility]  Information provided

## 2014-10-19 NOTE — ED Notes (Addendum)

## 2014-10-19 NOTE — ED Notes (Signed)
Supper provided along with an extra drink  Pt observed with no unusual behavior  Appropriate to stimulation  No verbalized needs or concerns at this time  NAD assessed  Continue to monitor 

## 2014-10-19 NOTE — ED Notes (Signed)
Patient is resting comfortably.  Alert.  Sitting up in bed.  NAD.

## 2014-10-19 NOTE — ED Notes (Signed)
Patient resting with eyes closed and room lights turned off. Respirations even and unlabored, NAD noted at this time. Sitter and ODS officer at bedside. Will continue to monitor.  

## 2014-10-19 NOTE — ED Notes (Signed)

## 2014-10-21 LAB — URINE CULTURE

## 2014-11-21 ENCOUNTER — Emergency Department
Admission: EM | Admit: 2014-11-21 | Discharge: 2014-11-21 | Discharge status: Against Medical Advice | Payer: Medicare Other | Attending: Emergency Medicine | Admitting: Emergency Medicine

## 2014-11-21 ENCOUNTER — Encounter: Payer: Self-pay | Admitting: Pain Medicine

## 2014-11-21 ENCOUNTER — Ambulatory Visit: Payer: Medicare Other | Attending: Pain Medicine | Admitting: Pain Medicine

## 2014-11-21 VITALS — BP 126/90 | HR 90 | Temp 97.8°F | Resp 18 | Ht 60.0 in | Wt 150.0 lb

## 2014-11-21 DIAGNOSIS — Z720 Tobacco use: Secondary | ICD-10-CM | POA: Insufficient documentation

## 2014-11-21 DIAGNOSIS — I10 Essential (primary) hypertension: Secondary | ICD-10-CM | POA: Diagnosis not present

## 2014-11-21 DIAGNOSIS — M5136 Other intervertebral disc degeneration, lumbar region: Secondary | ICD-10-CM | POA: Diagnosis not present

## 2014-11-21 DIAGNOSIS — M533 Sacrococcygeal disorders, not elsewhere classified: Secondary | ICD-10-CM

## 2014-11-21 DIAGNOSIS — M5481 Occipital neuralgia: Secondary | ICD-10-CM | POA: Diagnosis not present

## 2014-11-21 DIAGNOSIS — M47816 Spondylosis without myelopathy or radiculopathy, lumbar region: Secondary | ICD-10-CM | POA: Diagnosis not present

## 2014-11-21 DIAGNOSIS — F419 Anxiety disorder, unspecified: Secondary | ICD-10-CM | POA: Insufficient documentation

## 2014-11-21 DIAGNOSIS — M51369 Other intervertebral disc degeneration, lumbar region without mention of lumbar back pain or lower extremity pain: Secondary | ICD-10-CM

## 2014-11-21 DIAGNOSIS — R51 Headache: Secondary | ICD-10-CM | POA: Diagnosis not present

## 2014-11-21 DIAGNOSIS — M79604 Pain in right leg: Secondary | ICD-10-CM | POA: Diagnosis present

## 2014-11-21 DIAGNOSIS — M79605 Pain in left leg: Secondary | ICD-10-CM | POA: Diagnosis present

## 2014-11-21 DIAGNOSIS — F4310 Post-traumatic stress disorder, unspecified: Secondary | ICD-10-CM

## 2014-11-21 DIAGNOSIS — M545 Low back pain: Secondary | ICD-10-CM | POA: Diagnosis present

## 2014-11-21 MED ORDER — OXYCODONE HCL 5 MG PO TABS: ORAL_TABLET | ORAL | Status: DC

## 2014-11-21 NOTE — Progress Notes (Signed)
   Subjective:    Patient ID: Sherri Foster, female    DOB: Oct 28, 1960, 54 y.o.   MRN: [id number]  HPI  Patient is 54 year old female [facility] evaluation and treatment of pain involving the lower back and lower extremity regions predominantly patient also has had pain involving the neck with headaches and states that the pain of the lower back and lower extremity region is most bothersome at this time. Patient denies any recent trauma change in events of daily living the call significant change in symptomatology. We discussed patient's condition and will consider patient for lumbar facet, medial branch nerve, blocks at time return appointment. Patient states that her pain is aggravated with twisting turning standing and walking and the pain becomes more intense as the day progresses. We will proceed with lumbar facet, medial branch nerve blocks and attempt to decrease severity of symptoms, minimize progression of symptoms, and avoid need for more involved treatment. The patient was in agreement with suggested treatment plan.     Review of Systems     Objective:   Physical Exam  There was tenderness over the splenius capitis occipitalis muscles of mild to moderate degree. No new lesions of the head and neck were noted. There were no bounding pulsations of the temporal region. Palpation over the cervical facet cervical paraspinal musculature region reproduced pain of mild to moderate degree. There appeared to be unremarkable Spurling's maneuver. Palpation of the acromioclavicular and glenohumeral joint regions reproduced mild discomfort. Patient appeared to be with bilaterally equal grip strength and Tinel and Phalen's maneuver appeared to be without increased pain of significant degree. There was tends to palpation over the region of the thoracic facet thoracic paraspinal musculature region of mild degree. No crepitus of the thoracic region was noted.  Palpation over the lumbar paraspinal musculature region lumbar facet region reproduces moderate to moderately severe discomfort. Lateral bending rotation and extension and palpation over the lumbar facets reproduce moderately severe discomfort. There was tenderness of the PSIS and PII S region of mild to moderate degree. No definite sensory deficit of dermatomal distribution was detected. There appeared to be negative clonus and negative Homans. DTRs were difficult to elicit patient had difficulty relaxing. There was moderate increase of pain with pressure prior to the ilium with patient in lateral decubitus position as well as palpation over the PSIS and PII S region. No definite sensory deficit of dermatomal distribution was detected. There was negative clonus negative Homans. Abdomen nontender with no costovertebral tenderness noted. 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 Greggory Stallion  for evaliation of  BP and general medical  condition  F/U surgical evaluation. May consider pending follow-up evaluations  F/U neurological evaluation. May consider pending follow-up evaluations  May consider radiofrequency rhizolysis or intraspinal procedures pending response to present treatment and F/U evaluation   Patient to call [facility] should patient have concerns prior to scheduled return appointment.

## 2014-11-21 NOTE — ED Notes (Addendum)
Pt to triage via w/c, brought in by EMS for anxiety attack tonight; st hx of same; also c/o frontal HA x 2-3weeks

## 2014-11-21 NOTE — Progress Notes (Signed)
Safety precautions to be maintained throughout the outpatient stay will include: orient to surroundings, keep bed in low position, maintain call bell within reach at all times, provide assistance with transfer out of bed and ambulation.  

## 2014-11-21 NOTE — Patient Instructions (Addendum)
PLAN   Continue present medication oxycodone  F/U PCP Dr. [place] for evaliation of  BP and general medical  condition  F/U surgical evaluation. May consider pending follow-up evaluations  F/U neurological evaluation. May consider pending follow-up evaluations  May consider radiofrequency rhizolysis or intraspinal procedures pending response to present treatment and F/U evaluation   Patient to call [facility] should patient have concerns prior to scheduled return appointment. Pain Management Discharge Instructions  General Discharge Instructions :  If you need to reach your doctor call: Monday-Friday 8:00 am - 4:00 pm at [phone] or toll free [phone].  After clinic hours [phone] to have operator reach doctor.  Bring all of your medication bottles to all your appointments in the pain clinic.  To cancel or reschedule your appointment with Pain Management please remember to call 24 hours in advance to avoid a fee.  Refer to the educational materials which you have been given on: General Risks, I had my Procedure. Discharge Instructions, Post Sedation.  Post Procedure Instructions:  The drugs you were given will stay in your system until tomorrow, so for the next 24 hours you should not drive, make any legal decisions or drink any alcoholic beverages.  You may eat anything you prefer, but it is better to start with liquids then soups and crackers, and gradually work up to solid foods.  Please notify your doctor immediately if you have any unusual bleeding, trouble breathing or pain that is not related to your normal pain.  Depending on the type of procedure that was done, some parts of your body may feel week and/or numb.  This usually clears up by tonight or the next day.  Walk with the use of an assistive device or accompanied by an adult for the 24 hours.  You may use ice on the affected area for the first 24 hours.  Put ice in a Ziploc bag and cover  with a towel and place against area 15 minutes on 15 minutes off.  You may switch to heat after 24 hours.GENERAL RISKS AND COMPLICATIONS  What are the risk, side effects and possible complications? Generally speaking, most procedures are safe.  However, with any procedure there are risks, side effects, and the possibility of complications.  The risks and complications are dependent upon the sites that are lesioned, or the type of nerve block to be performed.  The closer the procedure is to the spine, the more serious the risks are.  Great care is taken when placing the radio frequency needles, block needles or lesioning probes, but sometimes complications can occur. 1. Infection: Any time there is an injection through the skin, there is a risk of infection.  This is why sterile conditions are used for these blocks.  There are four possible types of infection. 1. Localized skin infection. 2. Central Nervous System Infection-This can be in the form of Meningitis, which can be deadly. 3. Epidural Infections-This can be in the form of an epidural abscess, which can cause pressure inside of the spine, causing compression of the spinal cord with subsequent paralysis. This would require an emergency surgery to decompress, and there are no guarantees that the patient would recover from the paralysis. 4. Discitis-This is an infection of the intervertebral discs.  It occurs in about 1% of discography procedures.  It is difficult to treat and it may lead to surgery.        2. Pain: the needles have to go through skin and soft tissues,  will cause soreness.       3. Damage to internal structures:  The nerves to be lesioned may be near blood vessels or    other nerves which can be potentially damaged.       4. Bleeding: Bleeding is more common if the patient is taking blood thinners such as  aspirin, Coumadin, Ticiid, Plavix, etc., or if he/she have some genetic predisposition  such as hemophilia. Bleeding into the  spinal canal can cause compression of the spinal  cord with subsequent paralysis.  This would require an emergency surgery to  decompress and there are no guarantees that the patient would recover from the  paralysis.       5. Pneumothorax:  Puncturing of a lung is a possibility, every time a needle is introduced in  the area of the chest or upper back.  Pneumothorax refers to free air around the  collapsed lung(s), inside of the thoracic cavity (chest cavity).  Another two possible  complications related to a similar event would include: Hemothorax and Chylothorax.   These are variations of the Pneumothorax, where instead of air around the collapsed  lung(s), you may have blood or chyle, respectively.       6. Spinal headaches: They may occur with any procedures in the area of the spine.       7. Persistent CSF (Cerebro-Spinal Fluid) leakage: This is a rare problem, but may occur  with prolonged intrathecal or epidural catheters either due to the formation of a fistulous  track or a dural tear.       8. Nerve damage: By working so close to the spinal cord, there is always a possibility of  nerve damage, which could be as serious as a permanent spinal cord injury with  paralysis.       9. Death:  Although rare, severe deadly allergic reactions known as "Anaphylactic  reaction" can occur to any of the medications used.      10. Worsening of the symptoms:  We can always make thing worse.  What are the chances of something like this happening? Chances of any of this occuring are extremely low.  By statistics, you have more of a chance of getting killed in a motor vehicle accident: while driving to the hospital than any of the above occurring .  Nevertheless, you should be aware that they are possibilities.  In general, it is similar to taking a shower.  Everybody knows that you can slip, hit your head and get killed.  Does that mean that you should not shower again?  Nevertheless always keep in mind that  statistics do not mean anything if you happen to be on the wrong side of them.  Even if a procedure has a 1 (one) in a 1,000,000 (million) chance of going wrong, it you happen to be that one..Also, keep in mind that by statistics, you have more of a chance of having something go wrong when taking medications.  Who should not have this procedure? If you are on a blood thinning medication (e.g. Coumadin, Plavix, see list of "Blood Thinners"), or if you have an active infection going on, you should not have the procedure.  If you are taking any blood thinners, please inform your physician.  How should I prepare for this procedure?  Do not eat or drink anything at least six hours prior to the procedure.  Bring a driver with you .  It cannot be a taxi.  Come accompanied  by an adult that can drive you back, and that is strong enough to help you if your legs get weak or numb from the local anesthetic.  Take all of your medicines the morning of the procedure with just enough water to swallow them.  If you have diabetes, make sure that you are scheduled to have your procedure done first thing in the morning, whenever possible.  If you have diabetes, take only half of your insulin dose and notify our nurse that you have done so as soon as you arrive at the clinic.  If you are diabetic, but only take blood sugar pills (oral hypoglycemic), then do not take them on the morning of your procedure.  You may take them after you have had the procedure.  Do not take aspirin or any aspirin-containing medications, at least eleven (11) days prior to the procedure.  They may prolong bleeding.  Wear loose fitting clothing that may be easy to take off and that you would not mind if it got stained with Betadine or blood.  Do not wear any jewelry or perfume  Remove any nail coloring.  It will interfere with some of our monitoring equipment.  NOTE: Remember that this is not meant to be interpreted as a complete  list of all possible complications.  Unforeseen problems may occur.  BLOOD THINNERS The following drugs contain aspirin or other products, which can cause increased bleeding during surgery and should not be taken for 2 weeks prior to and 1 week after surgery.  If you should need take something for relief of minor pain, you may take acetaminophen which is found in Tylenol,m Datril, Anacin-3 and Panadol. It is not blood thinner. The products listed below are.  Do not take any of the products listed below in addition to any listed on your instruction sheet.  A.P.C or A.P.C with Codeine Codeine Phosphate Capsules #3 Ibuprofen Ridaura  ABC compound Congesprin Imuran rimadil  Advil Cope Indocin Robaxisal  Alka-Seltzer Effervescent Pain Reliever and Antacid Coricidin or Coricidin-D  Indomethacin Rufen  Alka-Seltzer plus Cold Medicine Cosprin Ketoprofen S-A-C Tablets  Anacin Analgesic Tablets or Capsules Coumadin Korlgesic Salflex  Anacin Extra Strength Analgesic tablets or capsules CP-2 Tablets Lanoril Salicylate  Anaprox Cuprimine Capsules Levenox Salocol  Anexsia-D Dalteparin Magan Salsalate  Anodynos Darvon compound Magnesium Salicylate Sine-off  Ansaid Dasin Capsules Magsal Sodium Salicylate  Anturane Depen Capsules Marnal Soma  APF Arthritis pain formula Dewitt's Pills Measurin Stanback  Argesic Dia-Gesic Meclofenamic Sulfinpyrazone  Arthritis Bayer Timed Release Aspirin Diclofenac Meclomen Sulindac  Arthritis pain formula Anacin Dicumarol Medipren Supac  Analgesic (Safety coated) Arthralgen Diffunasal Mefanamic Suprofen  Arthritis Strength Bufferin Dihydrocodeine Mepro Compound Suprol  Arthropan liquid Dopirydamole Methcarbomol with Aspirin Synalgos  ASA tablets/Enseals Disalcid Micrainin Tagament  Ascriptin Doan's Midol Talwin  Ascriptin A/D Dolene Mobidin Tanderil  Ascriptin Extra Strength Dolobid Moblgesic Ticlid  Ascriptin with Codeine Doloprin or Doloprin with Codeine Momentum  Tolectin  Asperbuf Duoprin Mono-gesic Trendar  Aspergum Duradyne Motrin or Motrin IB Triminicin  Aspirin plain, buffered or enteric coated Durasal Myochrisine Trigesic  Aspirin Suppositories Easprin Nalfon Trillsate  Aspirin with Codeine Ecotrin Regular or Extra Strength Naprosyn Uracel  Atromid-S Efficin Naproxen Ursinus  Auranofin Capsules Elmiron Neocylate Vanquish  Axotal Emagrin Norgesic Verin  Azathioprine Empirin or Empirin with Codeine Normiflo Vitamin E  Azolid Emprazil Nuprin Voltaren  Bayer Aspirin plain, buffered or children's or timed BC Tablets or powders Encaprin Orgaran Warfarin Sodium  Buff-a-Comp Enoxaparin Orudis Zorpin  Buff-a-Comp with  Codeine Equegesic Os-Cal-Gesic   Buffaprin Excedrin plain, buffered or Extra Strength Oxalid   Bufferin Arthritis Strength Feldene Oxphenbutazone   Bufferin plain or Extra Strength Feldene Capsules Oxycodone with Aspirin   Bufferin with Codeine Fenoprofen Fenoprofen Pabalate or Pabalate-SF   Buffets II Flogesic Panagesic   Buffinol plain or Extra Strength Florinal or Florinal with Codeine Panwarfarin   Buf-Tabs Flurbiprofen Penicillamine   Butalbital Compound Four-way cold tablets Penicillin   Butazolidin Fragmin Pepto-Bismol   Carbenicillin Geminisyn Percodan   Carna Arthritis Reliever Geopen Persantine   Carprofen Gold's salt Persistin   Chloramphenicol Goody's Phenylbutazone   Chloromycetin Haltrain Piroxlcam   Clmetidine heparin Plaquenil   Cllnoril Hyco-pap Ponstel   Clofibrate Hydroxy chloroquine Propoxyphen         Before stopping any of these medications, be sure to consult the physician who ordered them.  Some, such as Coumadin (Warfarin) are ordered to prevent or treat serious conditions such as "deep thrombosis", "pumonary embolisms", and other heart problems.  The amount of time that you may need off of the medication may also vary with the medication and the reason for which you were taking it.  If you are taking any  of these medications, please make sure you notify your pain physician before you undergo any procedures.         Facet Joint Block The facet joints connect the bones of the spine (vertebrae). They make it possible for you to bend, twist, and make other movements with your spine. They also prevent you from overbending, overtwisting, and making other excessive movements.  A facet joint block is a procedure where a numbing medicine (anesthetic) is injected into a facet joint. Often, a type of anti-inflammatory medicine called a steroid is also injected. A facet joint block may be done for two reasons:  2. Diagnosis. A facet joint block may be done as a test to see whether neck or back pain is caused by a worn-down or infected facet joint. If the pain gets better after a facet joint block, it means the pain is probably coming from the facet joint. If the pain does not get better, it means the pain is probably not coming from the facet joint.  3. Therapy. A facet joint block may be done to relieve neck or back pain caused by a facet joint. A facet joint block is only done as a therapy if the pain does not improve with medicine, exercise programs, physical therapy, and other forms of pain management. LET [facility] CARE PROVIDER KNOW ABOUT:   Any allergies you have.   All medicines you are taking, including vitamins, herbs, eyedrops, and over-the-counter medicines and creams.   Previous problems you or members of your family have had with the use of anesthetics.   Any blood disorders you have had.   Other health problems you have. RISKS AND COMPLICATIONS Generally, having a facet joint block is safe. However, as with any procedure, complications can occur. Possible complications associated with having a facet joint block include:   Bleeding.   Injury to a nerve near the injection site.   Pain at the injection site.   Weakness or numbness in areas controlled by nerves near the  injection site.   Infection.   Temporary fluid retention.   Allergic reaction to anesthetics or medicines used during the procedure. BEFORE THE PROCEDURE   Follow your health care provider's instructions if you are taking dietary supplements or medicines. You may need to stop taking them  or reduce your dosage.   Do not take any new dietary supplements or medicines without asking your health care provider first.   Follow your health care provider's instructions about eating and drinking before the procedure. You may need to stop eating and drinking several hours before the procedure.   Arrange to have an adult drive you home after the procedure. PROCEDURE 12. You may need to remove your clothing and dress in an open-back gown so that your health care provider can access your spine.  13. The procedure will be done while you are lying on an X-ray table. Most of the time you will be asked to lie on your stomach, but you may be asked to lie in a different position if an injection will be made in your neck.  14. Special machines will be used to monitor your oxygen levels, heart rate, and blood pressure.  15. If an injection will be made in your neck, an intravenous (IV) tube will be inserted into one of your veins. Fluids and medicine will flow directly into your body through the IV tube.  16. The area over the facet joint where the injection will be made will be cleaned with an antiseptic soap. The surrounding skin will be covered with sterile drapes.  17. An anesthetic will be applied to your skin to make the injection area numb. You may feel a temporary stinging or burning sensation.  18. A video X-ray machine will be used to locate the joint. A contrast dye may be injected into the facet joint area to help with locating the joint.  19. When the joint is located, an anesthetic medicine will be injected into the joint through the needle.  20. Your health care provider will ask you  whether you feel pain relief. If you do feel relief, a steroid may be injected to provide pain relief for a longer period of time. If you do not feel relief or feel only partial relief, additional injections of an anesthetic may be made in other facet joints.  21. The needle will be removed, the skin will be cleansed, and bandages will be applied.  AFTER THE PROCEDURE   You will be observed for 15-30 minutes before being allowed to go home. Do not drive. Have an adult drive you or take a taxi or public transportation instead.   If you feel pain relief, the pain will return in several hours or days when the anesthetic wears off.   You may feel pain relief 2-14 days after the procedure. The amount of time this relief lasts varies from person to person.   It is normal to feel some tenderness over the injected area(s) for 2 days following the procedure.   If you have diabetes, you may have a temporary increase in blood sugar. Document Released: 07/16/2006 Document Revised: 07/11/2013 Document Reviewed: 12/15/2011 [facility] Patient Information 2015 [place], [place]. This information is not intended to replace advice given to you by your health care provider. Make sure you discuss any questions you have with your health care provider.

## 2014-11-28 ENCOUNTER — Other Ambulatory Visit: Payer: Self-pay | Admitting: Pain Medicine

## 2014-12-20 ENCOUNTER — Ambulatory Visit: Payer: Medicare Other | Attending: Pain Medicine | Admitting: Pain Medicine

## 2014-12-20 ENCOUNTER — Encounter: Payer: Self-pay | Admitting: Pain Medicine

## 2014-12-20 VITALS — BP 122/69 | HR 66 | Temp 96.5°F | Resp 16 | Ht 60.0 in | Wt 145.0 lb

## 2014-12-20 DIAGNOSIS — M51369 Other intervertebral disc degeneration, lumbar region without mention of lumbar back pain or lower extremity pain: Secondary | ICD-10-CM

## 2014-12-20 DIAGNOSIS — M5136 Other intervertebral disc degeneration, lumbar region: Secondary | ICD-10-CM | POA: Diagnosis not present

## 2014-12-20 DIAGNOSIS — M545 Low back pain: Secondary | ICD-10-CM | POA: Diagnosis present

## 2014-12-20 DIAGNOSIS — F4310 Post-traumatic stress disorder, unspecified: Secondary | ICD-10-CM

## 2014-12-20 DIAGNOSIS — M5481 Occipital neuralgia: Secondary | ICD-10-CM

## 2014-12-20 DIAGNOSIS — M47816 Spondylosis without myelopathy or radiculopathy, lumbar region: Secondary | ICD-10-CM | POA: Diagnosis not present

## 2014-12-20 DIAGNOSIS — M533 Sacrococcygeal disorders, not elsewhere classified: Secondary | ICD-10-CM

## 2014-12-20 MED ORDER — MIDAZOLAM HCL 5 MG/5ML IJ SOLN
5.0000 mg | Freq: Once | INTRAMUSCULAR | Status: AC
  Administered 2014-12-20: 5 mg via INTRAVENOUS

## 2014-12-20 MED ORDER — SODIUM CHLORIDE 0.9 % IJ SOLN: 20.0000 mL | Freq: Once | INTRAMUSCULAR | Status: DC

## 2014-12-20 MED ORDER — FENTANYL CITRATE (PF) 100 MCG/2ML IJ SOLN
INTRAMUSCULAR | Status: AC
  Administered 2014-12-20: 100 ug via INTRAVENOUS
  Filled 2014-12-20: qty 2

## 2014-12-20 MED ORDER — ORPHENADRINE CITRATE 30 MG/ML IJ SOLN
60.0000 mg | Freq: Once | INTRAMUSCULAR | Status: AC
  Administered 2014-12-20: 09:00:00 via INTRAMUSCULAR

## 2014-12-20 MED ORDER — TRIAMCINOLONE ACETONIDE 40 MG/ML IJ SUSP
INTRAMUSCULAR | Status: AC
  Filled 2014-12-20: qty 1

## 2014-12-20 MED ORDER — MIDAZOLAM HCL 5 MG/5ML IJ SOLN
INTRAMUSCULAR | Status: AC
  Administered 2014-12-20: 5 mg via INTRAVENOUS
  Filled 2014-12-20: qty 5

## 2014-12-20 MED ORDER — BUPIVACAINE HCL (PF) 0.25 % IJ SOLN
INTRAMUSCULAR | Status: AC
  Filled 2014-12-20: qty 30

## 2014-12-20 MED ORDER — FENTANYL CITRATE (PF) 100 MCG/2ML IJ SOLN
100.0000 ug | Freq: Once | INTRAMUSCULAR | Status: AC
  Administered 2014-12-20: 100 ug via INTRAVENOUS

## 2014-12-20 MED ORDER — LIDOCAINE HCL (PF) 1 % IJ SOLN: 10.0000 mL | Freq: Once | INTRAMUSCULAR | Status: DC

## 2014-12-20 MED ORDER — BUPIVACAINE HCL (PF) 0.25 % IJ SOLN
30.0000 mL | Freq: Once | INTRAMUSCULAR | Status: AC
  Administered 2014-12-20: 09:00:00

## 2014-12-20 MED ORDER — OXYCODONE HCL 5 MG PO TABS: ORAL_TABLET | ORAL | Status: DC

## 2014-12-20 MED ORDER — ORPHENADRINE CITRATE 30 MG/ML IJ SOLN
INTRAMUSCULAR | Status: AC
Start: 2014-12-20 — End: 2014-12-20
  Filled 2014-12-20: qty 2

## 2014-12-20 MED ORDER — LACTATED RINGERS IV SOLN: 1000.0000 mL | INTRAVENOUS | Status: DC

## 2014-12-20 MED ORDER — TRIAMCINOLONE ACETONIDE 40 MG/ML IJ SUSP
40.0000 mg | Freq: Once | INTRAMUSCULAR | Status: AC
  Administered 2014-12-20: 09:00:00

## 2014-12-20 NOTE — Progress Notes (Signed)
Subjective:    Patient ID: Sherri Foster, female    DOB: 1961-01-16, 54 y.o.   MRN: [id number]  HPI  PROCEDURE PERFORMED: Lumbar facet (medial branch block)   NOTE: The patient is a 54 y.o. female who [facility] further evaluation and treatment of pain involving the lumbar and lower extremity region. 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 The risks, benefits, and expectations of the procedure have been discussed and explained to the patient who was understanding and in agreement with suggested treatment plan. We will proceed with interventional treatment as discussed and as explained to the patient who was understanding and wished to proceed with procedure as planned.   DESCRIPTION OF PROCEDURE: Lumbar facet (medial branch block) with IV Versed, IV fentanyl conscious sedation, EKG, blood pressure, pulse, and pulse oximetry monitoring. The procedure was performed with the patient in the prone position. Betadine prep of proposed entry site performed.   NEEDLE PLACEMENT AT: Left L 3 lumbar facet (medial branch block). Under fluoroscopic guidance with oblique orientation of 15 degrees, a 22-gauge needle was inserted at the L 3 vertebral body level with needle placed at the targeted area of Burton's Eye or Eye of the Scotty Dog with documentation of needle placement in the superior and lateral border of targeted area of Burton's Eye or Eye of the Scotty Dog with oblique orientation of 15 degrees. Following documentation of needle placement at the L 3 vertebral body level, needle placement was then accomplished at the L 4 vertebral body level.   NEEDLE PLACEMENT AT L4 AND L5 VERTEBRAL BODY LEVELS ON THE LEFT SIDE The procedure was performed at the L4 and L5  vertebral body levels exactly as was performed  at the L 3  vertebral body level utilizing the same technique and under fluoroscopic guidance.  NEEDLE PLACEMENT AT THE SACRAL ALA with AP view of the lumbosacral spine. With the patient in the prone position, Betadine prep of proposed entry site accomplished, a 22 gauge needle was inserted in the region of the sacral ala (groove formed by the superior articulating process of S1 and the sacral wing). Following documentation of needle placement at the sacral ala,  needle placement was then accomplished at the S1 foramen level.   NEEDLE PLACEMENT AT THE S1 FORAMEN LEVEL under fluoroscopic guidance with AP view of the lumbosacral spine and cephalad orientation of the fluoroscope, a 22-gauge needle was placed at the superior and lateral border of the S1 foramen under fluoroscopic guidance. Following documentation of needle placement at the S1 foramen.   Needle placement was then verified at all levels on lateral view. Following documentation of needle placement at all levels on lateral view and following negative aspiration for heme and CSF, each level was injected with 1 mL of 0.25% bupivacaine with Kenalog.     LUMBAR FACET, MEDIAL BRANCH NERVE, BLOCKS PERFORMED ON THE RIGHT SIDE  The procedure was performed on the right side exactly as was performed on the left side at the same levels and utilizing the same technique under fluoroscopic guidance.     The patient tolerated the procedure well. A total of 40 mg of Kenalog was utilized for the procedure.   PLAN:  1. Medications: The patient will continue presently prescribed medication oxycodone  2. May consider modification of treatment regimen at time of  return appointment pending response to treatment rendered on today's visit. 3. The patient is to follow-up with primary care physician Dr. Greggory Stallion  for further evaluation of blood pressure and general medical condition status post steroid injection performed on today's visit. 4. Surgical follow-up  evaluation.May consider  5. Neurological follow-up evaluation.May consider [facility] EMG studies and other studies  6. The patient may be candidate for radiofrequency procedures, implantation type procedures, and other treatment pending response to treatment and follow-up evaluation. 7. The patient has been advised to call the [facility] prior to scheduled return appointment should there be significant change in condition or should patient have other concerns regarding condition prior to scheduled return appointment.  The patient is understanding and in agreement with suggested treatment plan.   Review of Systems     Objective:   Physical Exam        Assessment & Plan:

## 2014-12-20 NOTE — Progress Notes (Signed)
Safety precautions to be maintained throughout the outpatient stay will include: orient to surroundings, keep bed in low position, maintain call bell within reach at all times, provide assistance with transfer out of bed and ambulation.  

## 2014-12-20 NOTE — Patient Instructions (Addendum)
PLAN   Continue present medication oxycodone  F/U PCP Dr. Greggory Stallion for evaliation of  BP and general medical  condition  F/U surgical evaluation. May consider pending follow-up evaluations  F/U neurological evaluation. May consider pending follow-up evaluations  May consider radiofrequency rhizolysis or intraspinal procedures pending response to present treatment and F/U evaluation   Patient to call [facility] should patient have concerns prior to scheduled return appointment. GENERAL RISKS AND COMPLICATIONS  What are the risk, side effects and possible complications? Generally speaking, most procedures are safe.  However, with any procedure there are risks, side effects, and the possibility of complications.  The risks and complications are dependent upon the sites that are lesioned, or the type of nerve block to be performed.  The closer the procedure is to the spine, the more serious the risks are.  Great care is taken when placing the radio frequency needles, block needles or lesioning probes, but sometimes complications can occur. 1. Infection: Any time there is an injection through the skin, there is a risk of infection.  This is why sterile conditions are used for these blocks.  There are four possible types of infection. 1. Localized skin infection. 2. Central Nervous System Infection-This can be in the form of Meningitis, which can be deadly. 3. Epidural Infections-This can be in the form of an epidural abscess, which can cause pressure inside of the spine, causing compression of the spinal cord with subsequent paralysis. This would require an emergency surgery to decompress, and there are no guarantees that the patient would recover from the paralysis. 4. Discitis-This is an infection of the intervertebral discs.  It occurs in about 1% of discography procedures.  It is difficult to treat and it may lead to surgery.        2. Pain: the needles have to go through skin and  soft tissues, will cause soreness.       3. Damage to internal structures:  The nerves to be lesioned may be near blood vessels or    other nerves which can be potentially damaged.       4. Bleeding: Bleeding is more common if the patient is taking blood thinners such as  aspirin, Coumadin, Ticiid, Plavix, etc., or if he/she have some genetic predisposition  such as hemophilia. Bleeding into the spinal canal can cause compression of the spinal  cord with subsequent paralysis.  This would require an emergency surgery to  decompress and there are no guarantees that the patient would recover from the  paralysis.       5. Pneumothorax:  Puncturing of a lung is a possibility, every time a needle is introduced in  the area of the chest or upper back.  Pneumothorax refers to free air around the  collapsed lung(s), inside of the thoracic cavity (chest cavity).  Another two possible  complications related to a similar event would include: Hemothorax and Chylothorax.   These are variations of the Pneumothorax, where instead of air around the collapsed  lung(s), you may have blood or chyle, respectively.       6. Spinal headaches: They may occur with any procedures in the area of the spine.       7. Persistent CSF (Cerebro-Spinal Fluid) leakage: This is a rare problem, but may occur  with prolonged intrathecal or epidural catheters either due to the formation of a fistulous  track or a dural tear.       8. Nerve damage: By working so  close to the spinal cord, there is always a possibility of  nerve damage, which could be as serious as a permanent spinal cord injury with  paralysis.       9. Death:  Although rare, severe deadly allergic reactions known as "Anaphylactic  reaction" can occur to any of the medications used.      10. Worsening of the symptoms:  We can always make thing worse.  What are the chances of something like this happening? Chances of any of this occuring are extremely low.  By statistics, you  have more of a chance of getting killed in a motor vehicle accident: while driving to the hospital than any of the above occurring .  Nevertheless, you should be aware that they are possibilities.  In general, it is similar to taking a shower.  Everybody knows that you can slip, hit your head and get killed.  Does that mean that you should not shower again?  Nevertheless always keep in mind that statistics do not mean anything if you happen to be on the wrong side of them.  Even if a procedure has a 1 (one) in a 1,000,000 (million) chance of going wrong, it you happen to be that one..Also, keep in mind that by statistics, you have more of a chance of having something go wrong when taking medications.  Who should not have this procedure? If you are on a blood thinning medication (e.g. Coumadin, Plavix, see list of "Blood Thinners"), or if you have an active infection going on, you should not have the procedure.  If you are taking any blood thinners, please inform your physician.  How should I prepare for this procedure?  Do not eat or drink anything at least six hours prior to the procedure.  Bring a driver with you .  It cannot be a taxi.  Come accompanied by an adult that can drive you back, and that is strong enough to help you if your legs get weak or numb from the local anesthetic.  Take all of your medicines the morning of the procedure with just enough water to swallow them.  If you have diabetes, make sure that you are scheduled to have your procedure done first thing in the morning, whenever possible.  If you have diabetes, take only half of your insulin dose and notify our nurse that you have done so as soon as you arrive at the clinic.  If you are diabetic, but only take blood sugar pills (oral hypoglycemic), then do not take them on the morning of your procedure.  You may take them after you have had the procedure.  Do not take aspirin or any aspirin-containing medications, at least  eleven (11) days prior to the procedure.  They may prolong bleeding.  Wear loose fitting clothing that may be easy to take off and that you would not mind if it got stained with Betadine or blood.  Do not wear any jewelry or perfume  Remove any nail coloring.  It will interfere with some of our monitoring equipment.  NOTE: Remember that this is not meant to be interpreted as a complete list of all possible complications.  Unforeseen problems may occur.  BLOOD THINNERS The following drugs contain aspirin or other products, which can cause increased bleeding during surgery and should not be taken for 2 weeks prior to and 1 week after surgery.  If you should need take something for relief of minor pain, you may take acetaminophen which  is found in Tylenol,m Datril, Anacin-3 and Panadol. It is not blood thinner. The products listed below are.  Do not take any of the products listed below in addition to any listed on your instruction sheet.  A.P.C or A.P.C with Codeine Codeine Phosphate Capsules #3 Ibuprofen Ridaura  ABC compound Congesprin Imuran rimadil  Advil Cope Indocin Robaxisal  Alka-Seltzer Effervescent Pain Reliever and Antacid Coricidin or Coricidin-D  Indomethacin Rufen  Alka-Seltzer plus Cold Medicine Cosprin Ketoprofen S-A-C Tablets  Anacin Analgesic Tablets or Capsules Coumadin Korlgesic Salflex  Anacin Extra Strength Analgesic tablets or capsules CP-2 Tablets Lanoril Salicylate  Anaprox Cuprimine Capsules Levenox Salocol  Anexsia-D Dalteparin Magan Salsalate  Anodynos Darvon compound Magnesium Salicylate Sine-off  Ansaid Dasin Capsules Magsal Sodium Salicylate  Anturane Depen Capsules Marnal Soma  APF Arthritis pain formula Dewitt's Pills Measurin Stanback  Argesic Dia-Gesic Meclofenamic Sulfinpyrazone  Arthritis Bayer Timed Release Aspirin Diclofenac Meclomen Sulindac  Arthritis pain formula Anacin Dicumarol Medipren Supac  Analgesic (Safety coated) Arthralgen Diffunasal  Mefanamic Suprofen  Arthritis Strength Bufferin Dihydrocodeine Mepro Compound Suprol  Arthropan liquid Dopirydamole Methcarbomol with Aspirin Synalgos  ASA tablets/Enseals Disalcid Micrainin Tagament  Ascriptin Doan's Midol Talwin  Ascriptin A/D Dolene Mobidin Tanderil  Ascriptin Extra Strength Dolobid Moblgesic Ticlid  Ascriptin with Codeine Doloprin or Doloprin with Codeine Momentum Tolectin  Asperbuf Duoprin Mono-gesic Trendar  Aspergum Duradyne Motrin or Motrin IB Triminicin  Aspirin plain, buffered or enteric coated Durasal Myochrisine Trigesic  Aspirin Suppositories Easprin Nalfon Trillsate  Aspirin with Codeine Ecotrin Regular or Extra Strength Naprosyn Uracel  Atromid-S Efficin Naproxen Ursinus  Auranofin Capsules Elmiron Neocylate Vanquish  Axotal Emagrin Norgesic Verin  Azathioprine Empirin or Empirin with Codeine Normiflo Vitamin E  Azolid Emprazil Nuprin Voltaren  Bayer Aspirin plain, buffered or children's or timed BC Tablets or powders Encaprin Orgaran Warfarin Sodium  Buff-a-Comp Enoxaparin Orudis Zorpin  Buff-a-Comp with Codeine Equegesic Os-Cal-Gesic   Buffaprin Excedrin plain, buffered or Extra Strength Oxalid   Bufferin Arthritis Strength Feldene Oxphenbutazone   Bufferin plain or Extra Strength Feldene Capsules Oxycodone with Aspirin   Bufferin with Codeine Fenoprofen Fenoprofen Pabalate or Pabalate-SF   Buffets II Flogesic Panagesic   Buffinol plain or Extra Strength Florinal or Florinal with Codeine Panwarfarin   Buf-Tabs Flurbiprofen Penicillamine   Butalbital Compound Four-way cold tablets Penicillin   Butazolidin Fragmin Pepto-Bismol   Carbenicillin Geminisyn Percodan   Carna Arthritis Reliever Geopen Persantine   Carprofen Gold's salt Persistin   Chloramphenicol Goody's Phenylbutazone   Chloromycetin Haltrain Piroxlcam   Clmetidine heparin Plaquenil   Cllnoril Hyco-pap Ponstel   Clofibrate Hydroxy chloroquine Propoxyphen         Before stopping any of  these medications, be sure to consult the physician who ordered them.  Some, such as Coumadin (Warfarin) are ordered to prevent or treat serious conditions such as "deep thrombosis", "pumonary embolisms", and other heart problems.  The amount of time that you may need off of the medication may also vary with the medication and the reason for which you were taking it.  If you are taking any of these medications, please make sure you notify your pain physician before you undergo any procedures.         Pain Management Discharge Instructions  General Discharge Instructions :  If you need to reach your doctor call: Monday-Friday 8:00 am - 4:00 pm at [phone] or toll free [phone].  After clinic hours [phone] to have operator reach doctor.  Bring all of your medication bottles to all your  appointments in the pain clinic.  To cancel or reschedule your appointment with Pain Management please remember to call 24 hours in advance to avoid a fee.  Refer to the educational materials which you have been given on: General Risks, I had my Procedure. Discharge Instructions, Post Sedation.  Post Procedure Instructions:  The drugs you were given will stay in your system until tomorrow, so for the next 24 hours you should not drive, make any legal decisions or drink any alcoholic beverages.  You may eat anything you prefer, but it is better to start with liquids then soups and crackers, and gradually work up to solid foods.  Please notify your doctor immediately if you have any unusual bleeding, trouble breathing or pain that is not related to your normal pain.  Depending on the type of procedure that was done, some parts of your body may feel week and/or numb.  This usually clears up by tonight or the next day.  Walk with the use of an assistive device or accompanied by an adult for the 24 hours.  You may use ice on the affected area for the first 24 hours.  Put ice in a Ziploc bag and  cover with a towel and place against area 15 minutes on 15 minutes off.  You may switch to heat after 24 hours.

## 2014-12-21 ENCOUNTER — Telehealth: Payer: Self-pay | Admitting: *Deleted

## 2014-12-21 NOTE — Telephone Encounter (Signed)
No problems post procedure phone call. 

## 2015-01-18 ENCOUNTER — Encounter: Payer: Self-pay | Admitting: Pain Medicine

## 2015-01-18 ENCOUNTER — Ambulatory Visit: Payer: Medicare Other | Attending: Pain Medicine | Admitting: Pain Medicine

## 2015-01-18 VITALS — BP 148/81 | HR 85 | Temp 96.3°F | Resp 16 | Ht 60.0 in | Wt 142.0 lb

## 2015-01-18 DIAGNOSIS — F4310 Post-traumatic stress disorder, unspecified: Secondary | ICD-10-CM

## 2015-01-18 DIAGNOSIS — M79604 Pain in right leg: Secondary | ICD-10-CM | POA: Diagnosis present

## 2015-01-18 DIAGNOSIS — M545 Low back pain: Secondary | ICD-10-CM | POA: Diagnosis present

## 2015-01-18 DIAGNOSIS — M5136 Other intervertebral disc degeneration, lumbar region: Secondary | ICD-10-CM | POA: Insufficient documentation

## 2015-01-18 DIAGNOSIS — M5481 Occipital neuralgia: Secondary | ICD-10-CM

## 2015-01-18 DIAGNOSIS — M79605 Pain in left leg: Secondary | ICD-10-CM | POA: Diagnosis present

## 2015-01-18 DIAGNOSIS — M47816 Spondylosis without myelopathy or radiculopathy, lumbar region: Secondary | ICD-10-CM | POA: Insufficient documentation

## 2015-01-18 DIAGNOSIS — M533 Sacrococcygeal disorders, not elsewhere classified: Secondary | ICD-10-CM | POA: Insufficient documentation

## 2015-01-18 MED ORDER — OXYCODONE HCL 5 MG PO TABS: ORAL_TABLET | ORAL | Status: DC

## 2015-01-18 NOTE — Patient Instructions (Addendum)
PLAN   Continue present medication oxycodone    Block of the nerves to the sacroiliac joint to be performed at time of return appointment  F/U PCP Dr.George for evaliation of  BP and general medical  condition  F/U surgical evaluation. May consider pending follow-up evaluations  F/U neurological evaluation. May consider pending follow-up evaluations  May consider radiofrequency rhizolysis or intraspinal procedures pending response to present treatment and F/U evaluation  tment. Patient to call Pain Management Center should patient have concerns prior to scheduled return appoin Pain Management Discharge InstructionsPain Management Discharge Instructions  General Discharge Instructions :  If you need to reach your doctor call: Monday-Friday 8:00 am - 4:00 pm at [phone] or toll free [phone].  After clinic hours [phone] to have operator reach doctor.  Bring all of your medication bottles to all your appointments in the pain clinic.  To cancel or reschedule your appointment with Pain Management please remember to call 24 hours in advance to avoid a fee.  Refer to the educational materials which you have been given on: General Risks, I had my Procedure. Discharge Instructions, Post Sedation.  Post Procedure Instructions:  The drugs you were given will stay in your system until tomorrow, so for the next 24 hours you should not drive, make any legal decisions or drink any alcoholic beverages.  You may eat anything you prefer, but it is better to start with liquids then soups and crackers, and gradually work up to solid foods.  Please notify your doctor immediately if you have any unusual bleeding, trouble breathing or pain that is not related to your normal pain.  Depending on the type of procedure that was done, some parts of your body may feel week and/or numb.  This usually clears up by tonight or the next day.  Walk with the use of an assistive device or accompanied  by an adult for the 24 hours.  You may use ice on the affected area for the first 24 hours.  Put ice in a Ziploc bag and cover with a towel and place against area 15 minutes on 15 minutes off.  You may switch to heat after 24 hours.GENERAL RISKS AND COMPLICATIONS  What are the risk, side effects and possible complications? Generally speaking, most procedures are safe.  However, with any procedure there are risks, side effects, and the possibility of complications.  The risks and complications are dependent upon the sites that are lesioned, or the type of nerve block to be performed.  The closer the procedure is to the spine, the more serious the risks are.  Great care is taken when placing the radio frequency needles, block needles or lesioning probes, but sometimes complications can occur. 1. Infection: Any time there is an injection through the skin, there is a risk of infection.  This is why sterile conditions are used for these blocks.  There are four possible types of infection. 1. Localized skin infection. 2. Central Nervous System Infection-This can be in the form of Meningitis, which can be deadly. 3. Epidural Infections-This can be in the form of an epidural abscess, which can cause pressure inside of the spine, causing compression of the spinal cord with subsequent paralysis. This would require an emergency surgery to decompress, and there are no guarantees that the patient would recover from the paralysis. 4. Discitis-This is an infection of the intervertebral discs.  It occurs in about 1% of discography procedures.  It is difficult to treat and it may lead  to surgery.        2. Pain: the needles have to go through skin and soft tissues, will cause soreness.       3. Damage to internal structures:  The nerves to be lesioned may be near blood vessels or    other nerves which can be potentially damaged.       4. Bleeding: Bleeding is more common if the patient is taking blood thinners such as   aspirin, Coumadin, Ticiid, Plavix, etc., or if he/she have some genetic predisposition  such as hemophilia. Bleeding into the spinal canal can cause compression of the spinal  cord with subsequent paralysis.  This would require an emergency surgery to  decompress and there are no guarantees that the patient would recover from the  paralysis.       5. Pneumothorax:  Puncturing of a lung is a possibility, every time a needle is introduced in  the area of the chest or upper back.  Pneumothorax refers to free air around the  collapsed lung(s), inside of the thoracic cavity (chest cavity).  Another two possible  complications related to a similar event would include: Hemothorax and Chylothorax.   These are variations of the Pneumothorax, where instead of air around the collapsed  lung(s), you may have blood or chyle, respectively.       6. Spinal headaches: They may occur with any procedures in the area of the spine.       7. Persistent CSF (Cerebro-Spinal Fluid) leakage: This is a rare problem, but may occur  with prolonged intrathecal or epidural catheters either due to the formation of a fistulous  track or a dural tear.       8. Nerve damage: By working so close to the spinal cord, there is always a possibility of  nerve damage, which could be as serious as a permanent spinal cord injury with  paralysis.       9. Death:  Although rare, severe deadly allergic reactions known as "Anaphylactic  reaction" can occur to any of the medications used.      10. Worsening of the symptoms:  We can always make thing worse.  What are the chances of something like this happening? Chances of any of this occuring are extremely low.  By statistics, you have more of a chance of getting killed in a motor vehicle accident: while driving to the hospital than any of the above occurring .  Nevertheless, you should be aware that they are possibilities.  In general, it is similar to taking a shower.  Everybody knows that you can  slip, hit your head and get killed.  Does that mean that you should not shower again?  Nevertheless always keep in mind that statistics do not mean anything if you happen to be on the wrong side of them.  Even if a procedure has a 1 (one) in a 1,000,000 (million) chance of going wrong, it you happen to be that one..Also, keep in mind that by statistics, you have more of a chance of having something go wrong when taking medications.  Who should not have this procedure? If you are on a blood thinning medication (e.g. Coumadin, Plavix, see list of "Blood Thinners"), or if you have an active infection going on, you should not have the procedure.  If you are taking any blood thinners, please inform your physician.  How should I prepare for this procedure?  Do not eat or drink anything at least six  hours prior to the procedure.  Bring a driver with you .  It cannot be a taxi.  Come accompanied by an adult that can drive you back, and that is strong enough to help you if your legs get weak or numb from the local anesthetic.  Take all of your medicines the morning of the procedure with just enough water to swallow them.  If you have diabetes, make sure that you are scheduled to have your procedure done first thing in the morning, whenever possible.  If you have diabetes, take only half of your insulin dose and notify our nurse that you have done so as soon as you arrive at the clinic.  If you are diabetic, but only take blood sugar pills (oral hypoglycemic), then do not take them on the morning of your procedure.  You may take them after you have had the procedure.  Do not take aspirin or any aspirin-containing medications, at least eleven (11) days prior to the procedure.  They may prolong bleeding.  Wear loose fitting clothing that may be easy to take off and that you would not mind if it got stained with Betadine or blood.  Do not wear any jewelry or perfume  Remove any nail coloring.  It will  interfere with some of our monitoring equipment.  NOTE: Remember that this is not meant to be interpreted as a complete list of all possible complications.  Unforeseen problems may occur.  BLOOD THINNERS The following drugs contain aspirin or other products, which can cause increased bleeding during surgery and should not be taken for 2 weeks prior to and 1 week after surgery.  If you should need take something for relief of minor pain, you may take acetaminophen which is found in Tylenol,m Datril, Anacin-3 and Panadol. It is not blood thinner. The products listed below are.  Do not take any of the products listed below in addition to any listed on your instruction sheet.  A.P.C or A.P.C with Codeine Codeine Phosphate Capsules #3 Ibuprofen Ridaura  ABC compound Congesprin Imuran rimadil  Advil Cope Indocin Robaxisal  Alka-Seltzer Effervescent Pain Reliever and Antacid Coricidin or Coricidin-D  Indomethacin Rufen  Alka-Seltzer plus Cold Medicine Cosprin Ketoprofen S-A-C Tablets  Anacin Analgesic Tablets or Capsules Coumadin Korlgesic Salflex  Anacin Extra Strength Analgesic tablets or capsules CP-2 Tablets Lanoril Salicylate  Anaprox Cuprimine Capsules Levenox Salocol  Anexsia-D Dalteparin Magan Salsalate  Anodynos Darvon compound Magnesium Salicylate Sine-off  Ansaid Dasin Capsules Magsal Sodium Salicylate  Anturane Depen Capsules Marnal Soma  APF Arthritis pain formula Dewitt's Pills Measurin Stanback  Argesic Dia-Gesic Meclofenamic Sulfinpyrazone  Arthritis Bayer Timed Release Aspirin Diclofenac Meclomen Sulindac  Arthritis pain formula Anacin Dicumarol Medipren Supac  Analgesic (Safety coated) Arthralgen Diffunasal Mefanamic Suprofen  Arthritis Strength Bufferin Dihydrocodeine Mepro Compound Suprol  Arthropan liquid Dopirydamole Methcarbomol with Aspirin Synalgos  ASA tablets/Enseals Disalcid Micrainin Tagament  Ascriptin Doan's Midol Talwin  Ascriptin A/D Dolene Mobidin Tanderil   Ascriptin Extra Strength Dolobid Moblgesic Ticlid  Ascriptin with Codeine Doloprin or Doloprin with Codeine Momentum Tolectin  Asperbuf Duoprin Mono-gesic Trendar  Aspergum Duradyne Motrin or Motrin IB Triminicin  Aspirin plain, buffered or enteric coated Durasal Myochrisine Trigesic  Aspirin Suppositories Easprin Nalfon Trillsate  Aspirin with Codeine Ecotrin Regular or Extra Strength Naprosyn Uracel  Atromid-S Efficin Naproxen Ursinus  Auranofin Capsules Elmiron Neocylate Vanquish  Axotal Emagrin Norgesic Verin  Azathioprine Empirin or Empirin with Codeine Normiflo Vitamin E  Azolid Emprazil Nuprin Voltaren  Bayer Aspirin plain,  buffered or children's or timed BC Tablets or powders Encaprin Orgaran Warfarin Sodium  Buff-a-Comp Enoxaparin Orudis Zorpin  Buff-a-Comp with Codeine Equegesic Os-Cal-Gesic   Buffaprin Excedrin plain, buffered or Extra Strength Oxalid   Bufferin Arthritis Strength Feldene Oxphenbutazone   Bufferin plain or Extra Strength Feldene Capsules Oxycodone with Aspirin   Bufferin with Codeine Fenoprofen Fenoprofen Pabalate or Pabalate-SF   Buffets II Flogesic Panagesic   Buffinol plain or Extra Strength Florinal or Florinal with Codeine Panwarfarin   Buf-Tabs Flurbiprofen Penicillamine   Butalbital Compound Four-way cold tablets Penicillin   Butazolidin Fragmin Pepto-Bismol   Carbenicillin Geminisyn Percodan   Carna Arthritis Reliever Geopen Persantine   Carprofen Gold's salt Persistin   Chloramphenicol Goody's Phenylbutazone   Chloromycetin Haltrain Piroxlcam   Clmetidine heparin Plaquenil   Cllnoril Hyco-pap Ponstel   Clofibrate Hydroxy chloroquine Propoxyphen         Before stopping any of these medications, be sure to consult the physician who ordered them.  Some, such as Coumadin (Warfarin) are ordered to prevent or treat serious conditions such as "deep thrombosis", "pumonary embolisms", and other heart problems.  The amount of time that you may need off  of the medication may also vary with the medication and the reason for which you were taking it.  If you are taking any of these medications, please make sure you notify your pain physician before you undergo any procedures.         Sacroiliac (SI) Joint Injection Patient Information  Description: The sacroiliac joint connects the scrum (very low back and tailbone) to the ilium (a pelvic bone which also forms half of the hip joint).  Normally this joint experiences very little motion.  When this joint becomes inflamed or unstable low back and or hip and pelvis pain may result.  Injection of this joint with local anesthetics (numbing medicines) and steroids can provide diagnostic information and reduce pain.  This injection is performed with the aid of x-ray guidance into the tailbone area while you are lying on your stomach.   You may experience an electrical sensation down the leg while this is being done.  You may also experience numbness.  We also may ask if we are reproducing your normal pain during the injection.  Conditions which may be treated SI injection:   Low back, buttock, hip or leg pain  Preparation for the Injection:  1. Do not eat any solid food or dairy products within 6 hours of your appointment.  2. You may drink clear liquids up to 2 hours before appointment.  Clear liquids include water, black coffee, juice or soda.  No milk or cream please. 3. You may take your regular medications, including pain medications with a sip of water before your appointment.  Diabetics should hold regular insulin (if take separately) and take 1/2 normal NPH dose the morning of the procedure.  Carry some sugar containing items with you to your appointment. 4. A driver must accompany you and be prepared to drive you home after your procedure. 5. Bring all of your current medications with you. 6. An IV may be inserted and sedation may be given at the discretion of the physician. 7. A blood  pressure cuff, EKG and other monitors will often be applied during the procedure.  Some patients may need to have extra oxygen administered for a short period.  8. You will be asked to provide medical information, including your allergies, prior to the procedure.  We must know immediately if  you are taking blood thinners (like Coumadin/Warfarin) or if you are allergic to IV iodine contrast (dye).  We must know if you could possible be pregnant.  Possible side effects:   Bleeding from needle site  Infection (rare, may require surgery)  Nerve injury (rare)  Numbness & tingling (temporary)  A brief convulsion or seizure  Light-headedness (temporary)  Pain at injection site (several days)  Decreased blood pressure (temporary)  Weakness in the leg (temporary)   Call if you experience:   New onset weakness or numbness of an extremity below the injection site that last more than 8 hours.  Hives or difficulty breathing ( go to the emergency room)  Inflammation or drainage at the injection site  Any new symptoms which are concerning to you  Please note:  Although the local anesthetic injected can often make your back/ hip/ buttock/ leg feel good for several hours after the injections, the pain will likely return.  It takes 3-7 days for steroids to work in the sacroiliac area.  You may not notice any pain relief for at least that one week.  If effective, we will often do a series of three injections spaced 3-6 weeks apart to maximally decrease your pain.  After the initial series, we generally will wait some months before a repeat injection of the same type.  If you have any questions, please call [phone] [facility]

## 2015-01-18 NOTE — Progress Notes (Signed)
   Subjective:    Patient ID: Sherri Foster, female    DOB: 05-21-1960, 54 y.o.   MRN: [id number]  HPI  The patient is a 54 year old female who returns to pain management Center for further evaluation and treatment of pain involving the lower back lower extremity region with occasional headaches as well. Patient states that headaches have been well controlled since treatment in pain management Center. At the present time patient states most bothersome pain involves the lower back and the lower extremity region with pain radiating to the buttocks on the left as well as on the right. Patient states the pain is aggravated with walking and standing. Patient states the pain appears to be in the region of the buttocks on the left as well as on the right. Climbing stairs also aggravates the pain to severe degree. Patient denies trauma and states that the present pain appears to be no response to present treatment regimen. We will consider patient for pocket to the sacroiliac joint at time of return appointment and consider additional modifications of treatment pending follow-up evaluation. The patient agreed the suggested treatment plan      Review of Systems     Objective:   Physical Exam  There was tenderness to palpation over the cervical facet cervical paraspinal muscular region of mild degree. There was mild tenderness to palpation over the acromioclavicular and glenohumeral joint regions and patient appeared to be bilaterally equal grip strength with Tinel and Phalen's maneuver reproducing minimal discomfort. Palpation over the thoracic facet thoracic paraspinal muscles reproduces mild discomfort as well crepitus of the thoracic region was noted. Palpation over the lumbar paraspinal muscles lumbar facet region was attended to palpation of moderate degree. Palpation over the PSIS and PI IS regions reproduced moderately severe discomfort. Straight leg raising was tolerates approximately 20.  Patrick's maneuver reproduce severe pain in the region of the gluteal region. No sensory deficit or dermatomal disabilities detected. 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 May consider pending follow-up evaluations  F/U neurological evaluation. May consider pending follow-up evaluations  May consider radiofrequency rhizolysis or intraspinal procedures pending response to present treatment and F/U evaluation

## 2015-01-18 NOTE — Progress Notes (Signed)
Safety precautions to be maintained throughout the outpatient stay will include: orient to surroundings, keep bed in low position, maintain call bell within reach at all times, provide assistance with transfer out of bed and ambulation.  

## 2015-01-24 ENCOUNTER — Ambulatory Visit: Payer: Medicare Other | Admitting: Pain Medicine

## 2015-02-14 ENCOUNTER — Ambulatory Visit: Payer: Medicare Other | Attending: Pain Medicine | Admitting: Pain Medicine

## 2015-02-14 ENCOUNTER — Encounter: Payer: Self-pay | Admitting: Pain Medicine

## 2015-02-14 VITALS — BP 118/83 | HR 68 | Temp 98.3°F | Resp 16 | Ht 60.0 in | Wt 142.0 lb

## 2015-02-14 DIAGNOSIS — M47816 Spondylosis without myelopathy or radiculopathy, lumbar region: Secondary | ICD-10-CM | POA: Diagnosis not present

## 2015-02-14 DIAGNOSIS — M533 Sacrococcygeal disorders, not elsewhere classified: Secondary | ICD-10-CM

## 2015-02-14 DIAGNOSIS — F4310 Post-traumatic stress disorder, unspecified: Secondary | ICD-10-CM

## 2015-02-14 DIAGNOSIS — M5136 Other intervertebral disc degeneration, lumbar region: Secondary | ICD-10-CM | POA: Diagnosis not present

## 2015-02-14 DIAGNOSIS — M545 Low back pain: Secondary | ICD-10-CM | POA: Insufficient documentation

## 2015-02-14 DIAGNOSIS — M5481 Occipital neuralgia: Secondary | ICD-10-CM

## 2015-02-14 MED ORDER — ORPHENADRINE CITRATE 30 MG/ML IJ SOLN: 60.0000 mg | Freq: Once | INTRAMUSCULAR | Status: DC

## 2015-02-14 MED ORDER — BUPIVACAINE HCL (PF) 0.25 % IJ SOLN
INTRAMUSCULAR | Status: AC
  Administered 2015-02-14: 30 mL
  Filled 2015-02-14: qty 30

## 2015-02-14 MED ORDER — MIDAZOLAM HCL 5 MG/5ML IJ SOLN
5.0000 mg | Freq: Once | INTRAMUSCULAR | Status: AC
  Administered 2015-02-14: 3 mg via INTRAVENOUS

## 2015-02-14 MED ORDER — LACTATED RINGERS IV SOLN: 1000.0000 mL | INTRAVENOUS | Status: DC

## 2015-02-14 MED ORDER — TRIAMCINOLONE ACETONIDE 40 MG/ML IJ SUSP
INTRAMUSCULAR | Status: AC
  Administered 2015-02-14: 40 mg
  Filled 2015-02-14: qty 1

## 2015-02-14 MED ORDER — BUPIVACAINE HCL (PF) 0.25 % IJ SOLN
30.0000 mL | Freq: Once | INTRAMUSCULAR | Status: AC
  Administered 2015-02-14: 30 mL

## 2015-02-14 MED ORDER — OXYCODONE HCL 5 MG PO TABS: ORAL_TABLET | ORAL | Status: DC

## 2015-02-14 MED ORDER — TRIAMCINOLONE ACETONIDE 40 MG/ML IJ SUSP
40.0000 mg | Freq: Once | INTRAMUSCULAR | Status: AC
  Administered 2015-02-14: 40 mg

## 2015-02-14 MED ORDER — FENTANYL CITRATE (PF) 100 MCG/2ML IJ SOLN
INTRAMUSCULAR | Status: AC
  Administered 2015-02-14: 100 ug via INTRAVENOUS
  Filled 2015-02-14: qty 2

## 2015-02-14 MED ORDER — FENTANYL CITRATE (PF) 100 MCG/2ML IJ SOLN
100.0000 ug | Freq: Once | INTRAMUSCULAR | Status: AC
  Administered 2015-02-14: 100 ug via INTRAVENOUS

## 2015-02-14 MED ORDER — MIDAZOLAM HCL 5 MG/5ML IJ SOLN
INTRAMUSCULAR | Status: AC
  Administered 2015-02-14: 3 mg via INTRAVENOUS
  Filled 2015-02-14: qty 5

## 2015-02-14 MED ORDER — ORPHENADRINE CITRATE 30 MG/ML IJ SOLN
INTRAMUSCULAR | Status: AC
  Filled 2015-02-14: qty 2

## 2015-02-14 NOTE — Patient Instructions (Signed)
Selective Nerve Root Block Patient Information  Description: Specific nerve roots exit the spinal canal and these nerves can be compressed and inflamed by a bulging disc and bone spurs.  By injecting steroids on the nerve root, we can potentially decrease the inflammation surrounding these nerves, which often leads to decreased pain.  Also, by injecting local anesthesia on the nerve root, this can provide us helpful information to give to your referring doctor if it decreases your pain.  Selective nerve root blocks can be done along the spine from the neck to the low back depending on the location of your pain.   After numbing the skin with local anesthesia, a small needle is passed to the nerve root and the position of the needle is verified using x-ray pictures.  After the needle is in correct position, we then deposit the medication.  You may experience a pressure sensation while this is being done.  The entire block usually lasts less than 15 minutes.  Conditions that may be treated with selective nerve root blocks:  Low back and leg pain  Spinal stenosis  Diagnostic block prior to potential surgery  Neck and arm pain  Post laminectomy syndrome  Preparation for the injection:  1. Do not eat any solid food or dairy products within 6 hours of your appointment. 2. You may drink clear liquids up to 2 hours before an appointment.  Clear liquids include water, black coffee, juice or soda.  No milk or cream please. 3. You may take your regular medications, including pain medications, with a sip of water before your appointment.  Diabetics should hold regular insulin (if taken separately) and take 1/2 normal NPH dose the morning of the procedure.  Carry some sugar containing items with you to your appointment. 4. A driver must accompany you and be prepared to drive you home after your procedure. 5. Bring all your current medications with you. 6. An IV may be inserted and sedation may be given at  the discretion of the physician. 7. A blood pressure cuff, EKG, and other monitors will often be applied during the procedure.  Some patients may need to have extra oxygen administered for a short period. 8. You will be asked to provide medical information, including allergies, prior to the procedure.  We must know immediately if you are taking blood  Thinners (like Coumadin) or if you are allergic to IV iodine contrast (dye).  Possible side-effects: All are usually temporary  Bleeding from needle site  Light headedness  Numbness and tingling  Decreased blood pressure  Weakness in arms/legs  Pressure sensation in back/neck  Pain at injection site (several days)  Possible complications: All are extremely rare  Infection  Nerve injury  Spinal headache (a headache wore with upright position)  Call if you experience:  Fever/chills associated with headache or increased back/neck pain  Headache worsened by an upright position  New onset weakness or numbness of an extremity below the injection site  Hives or difficulty breathing (go to the emergency room)  Inflammation or drainage at the injection site(s)  Severe back/neck pain greater than usual  New symptoms which are concerning to you  Please note:  Although the local anesthetic injected can often make your back or neck feel good for several hours after the injection the pain will likely return.  It takes 3-5 days for steroids to work on the nerve root. You may not notice any pain relief for at least one week.  If effective,   we will often do a series of 3 injections spaced 3-6 weeks apart to maximally decrease your pain.    If you have any questions, please call [phone] [facility] Dis[facility] Inst[facility]  General Discharge Instructions :  If you need to reach your doctor call: Monday-Friday 8:00 am - 4:00 pm at [phone] or toll free [phone].   After clinic hours [phone] to have operator reach doctor.  Bring all of your medication bottles to all your appointments in the pain clinic.  To cancel or reschedule your appointment with Pain Management please remember to call 24 hours in advance to avoid a fee.  Refer to the educational materials which you have been given on: General Risks, I had my Procedure. Discharge Instructions, Post Sedation.  Post Procedure Instructions:  The drugs you were given will stay in your system until tomorrow, so for the next 24 hours you should not drive, make any legal decisions or drink any alcoholic beverages.  You may eat anything you prefer, but it is better to start with liquids then soups and crackers, and gradually work up to solid foods.  Please notify your doctor immediately if you have any unusual bleeding, trouble breathing or pain that is not related to your normal pain.  Depending on the type of procedure that was done, some parts of your body may feel week and/or numb.  This usually clears up by tonight or the next day.  Walk with the use of an assistive device or accompanied by an adult for the 24 hours.  You may use ice on the affected area for the first 24 hours.  Put ice in a Ziploc bag and cover with a towel and place against area 15 minutes on 15 minutes off.  You may switch to heat after 24 hours. 

## 2015-02-14 NOTE — Progress Notes (Signed)
Subjective:    Patient ID: Sherri Foster, female    DOB: 29-Sep-1960, 54 y.o.   MRN: [id number]  HPI PROCEDURE:  Block of nerves to the sacroiliac joint.   NOTE:  The patient is a 54 y.o. female who [facility] the [facility] for further evaluation and treatment of pain involving the lower back and lower extremity region with pain in the region of the buttocks as well. 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   There is concern regarding a significant component of the patient's pain being due to sacroiliac joint dysfunction The risks, benefits, expectations of the procedure have been discussed and explained to the patient who is understanding and willing to proceed with interventional treatment in attempt to decrease severity of patient's symptoms, minimize the risk of medication escalation and  hopefully retard the progression of the patient's symptoms. We will proceed with what is felt to be a medically necessary procedure, block of nerves to the sacroiliac joint.   DESCRIPTION OF PROCEDURE:  Block of nerves to the sacroiliac joint.   The patient was taken to the fluoroscopy suite. With the patient in the prone position with EKG, blood pressure, pulse and pulse oximetry monitoring, IV Versed, IV fentanyl conscious sedation, Betadine prep of proposed entry site was performed.   Block of nerves at the L5 vertebral body level.   With the patient in prone position, under fluoroscopic guidance, a 22 -gauge needle was inserted at the L5 vertebral body level on the left side. With 15 degrees oblique orientation a 22 -gauge needle was inserted in the region known as Burton's eye or eye of the Scotty dog. Following documentation of needle placement in the area of Burton's eye or eye of the Scotty dog under fluoroscopic guidance, needle placement  was then accomplished at the sacral ala level on the left side.   Needle placement at the sacral ala.   With the patient in prone position under fluoroscopic guidance with AP view of the lumbosacral spine, a 22 -gauge needle was inserted in the region known as the sacral ala on the left side. Following documentation of needle placement on the left side under fluoroscopic guidance needle placement was then accomplished at the S1 foramen level.   Needle placement at the S1 foramen level.   With the patient in prone position under fluoroscopic guidance with AP view of the lumbosacral spine and cephalad orientation, a 22 -gauge needle was inserted at the superior and lateral border of the S1 foramen on the left side. Following documentation of needle placement at the S1 foramen level on the left side, needle placement was then accomplished at the S2 foramen level on the left side.   Needle placement at the S2 foramen level.   With the patient in prone position with AP view of the lumbosacral spine with cephalad orientation, a 22 - gauge needle was inserted at the superior and lateral border of the S2 foramen under fluoroscopic guidance on the left side. Following needle placement at the L5 vertebral body level, sacral ala, S1 foramen and S2 foramen on the left side, needle placement was verified on lateral view under fluoroscopic guidance.  Following needle placement documentation on lateral view, each needle was injected with 1 mL of 0.25% bupivacaine and Kenalog.   BLOCK OF THE NERVES TO SACROILIAC JOINT ON THE RIGHT SIDE The procedure was  performed on the right side at the same levels as was performed on the left side and utilizing the same technique as on the left side and was performed under fluoroscopic guidance as on the left side   A total of  of Kenalog was utilized for the procedure.   PLAN:  1. Medications: The patient will continue presently prescribed medication oxycodone 2. The  patient will be considered for modification of treatment regimen pending response to the procedure performed on today's visit.  3. The patient is to follow-up with primary care physician Dr. Greggory Stallion for evaluation of blood pressure and general medical condition following the procedure performed on today's visit.  4. Surgical evaluation as discussed.  5. Neurological evaluation as discussed.  6. The patient may be a candidate for radiofrequency procedures, implantation devices and other treatment pending response to treatment performed on today's visit and follow-up evaluation.  7. The patient has been advised to adhere to proper body mechanics and to avoid activities which may exacerbate the patient's symptoms.   Return appointment to [facility] as scheduled.          Review of Systems     Objective:   Physical Exam        Assessment & Plan:

## 2015-02-14 NOTE — Progress Notes (Signed)
Safety precautions to be maintained throughout the outpatient stay will include: orient to surroundings, keep bed in low position, maintain call bell within reach at all times, provide assistance with transfer out of bed and ambulation.  

## 2015-02-15 ENCOUNTER — Telehealth: Payer: Self-pay | Admitting: *Deleted

## 2015-02-15 NOTE — Telephone Encounter (Signed)
No problems post procedure. 

## 2015-02-23 ENCOUNTER — Other Ambulatory Visit: Payer: Self-pay | Admitting: Pain Medicine

## 2015-03-20 ENCOUNTER — Encounter: Payer: Self-pay | Admitting: Pain Medicine

## 2015-03-20 ENCOUNTER — Ambulatory Visit: Payer: Medicare Other | Attending: Pain Medicine | Admitting: Pain Medicine

## 2015-03-20 VITALS — BP 129/79 | HR 69 | Temp 97.5°F | Resp 16 | Ht 60.0 in | Wt 155.0 lb

## 2015-03-20 DIAGNOSIS — F4310 Post-traumatic stress disorder, unspecified: Secondary | ICD-10-CM

## 2015-03-20 DIAGNOSIS — M533 Sacrococcygeal disorders, not elsewhere classified: Secondary | ICD-10-CM | POA: Diagnosis not present

## 2015-03-20 DIAGNOSIS — M5481 Occipital neuralgia: Secondary | ICD-10-CM | POA: Insufficient documentation

## 2015-03-20 DIAGNOSIS — M5136 Other intervertebral disc degeneration, lumbar region: Secondary | ICD-10-CM | POA: Diagnosis not present

## 2015-03-20 DIAGNOSIS — R51 Headache: Secondary | ICD-10-CM | POA: Insufficient documentation

## 2015-03-20 DIAGNOSIS — M545 Low back pain: Secondary | ICD-10-CM | POA: Diagnosis present

## 2015-03-20 DIAGNOSIS — M47816 Spondylosis without myelopathy or radiculopathy, lumbar region: Secondary | ICD-10-CM | POA: Diagnosis not present

## 2015-03-20 MED ORDER — OXYCODONE HCL 5 MG PO TABS: ORAL_TABLET | ORAL | Status: DC

## 2015-03-20 NOTE — Patient Instructions (Addendum)
PLAN   Continue present medication oxycodone    F/U PCP Dr.George for evaliation of  BP and general medical  condition  F/U surgical evaluation. May consider pending follow-up evaluations  F/U neurological evaluation. May consider pending follow-up evaluations  May consider radiofrequency rhizolysis or intraspinal procedures pending response to present treatment and F/U evaluation   Patient to call [facility] should patient have concerns prior to scheduled return appoinmentGENERAL RISKS AND COMPLICATIONS  What are the risk, side effects and possible complications? Generally speaking, most procedures are safe.  However, with any procedure there are risks, side effects, and the possibility of complications.  The risks and complications are dependent upon the sites that are lesioned, or the type of nerve block to be performed.  The closer the procedure is to the spine, the more serious the risks are.  Great care is taken when placing the radio frequency needles, block needles or lesioning probes, but sometimes complications can occur. 1. Infection: Any time there is an injection through the skin, there is a risk of infection.  This is why sterile conditions are used for these blocks.  There are four possible types of infection. 1. Localized skin infection. 2. Central Nervous System Infection-This can be in the form of Meningitis, which can be deadly. 3. Epidural Infections-This can be in the form of an epidural abscess, which can cause pressure inside of the spine, causing compression of the spinal cord with subsequent paralysis. This would require an emergency surgery to decompress, and there are no guarantees that the patient would recover from the paralysis. 4. Discitis-This is an infection of the intervertebral discs.  It occurs in about 1% of discography procedures.  It is difficult to treat and it may lead to surgery.        2. Pain: the needles have to go through skin and soft  tissues, will cause soreness.       3. Damage to internal structures:  The nerves to be lesioned may be near blood vessels or    other nerves which can be potentially damaged.       4. Bleeding: Bleeding is more common if the patient is taking blood thinners such as  aspirin, Coumadin, Ticiid, Plavix, etc., or if he/she have some genetic predisposition  such as hemophilia. Bleeding into the spinal canal can cause compression of the spinal  cord with subsequent paralysis.  This would require an emergency surgery to  decompress and there are no guarantees that the patient would recover from the  paralysis.       5. Pneumothorax:  Puncturing of a lung is a possibility, every time a needle is introduced in  the area of the chest or upper back.  Pneumothorax refers to free air around the  collapsed lung(s), inside of the thoracic cavity (chest cavity).  Another two possible  complications related to a similar event would include: Hemothorax and Chylothorax.   These are variations of the Pneumothorax, where instead of air around the collapsed  lung(s), you may have blood or chyle, respectively.       6. Spinal headaches: They may occur with any procedures in the area of the spine.       7. Persistent CSF (Cerebro-Spinal Fluid) leakage: This is a rare problem, but may occur  with prolonged intrathecal or epidural catheters either due to the formation of a fistulous  track or a dural tear.       8. Nerve damage: By working so  close to the spinal cord, there is always a possibility of  nerve damage, which could be as serious as a permanent spinal cord injury with  paralysis.       9. Death:  Although rare, severe deadly allergic reactions known as "Anaphylactic  reaction" can occur to any of the medications used.      10. Worsening of the symptoms:  We can always make thing worse.  What are the chances of something like this happening? Chances of any of this occuring are extremely low.  By statistics, you have  more of a chance of getting killed in a motor vehicle accident: while driving to the hospital than any of the above occurring .  Nevertheless, you should be aware that they are possibilities.  In general, it is similar to taking a shower.  Everybody knows that you can slip, hit your head and get killed.  Does that mean that you should not shower again?  Nevertheless always keep in mind that statistics do not mean anything if you happen to be on the wrong side of them.  Even if a procedure has a 1 (one) in a 1,000,000 (million) chance of going wrong, it you happen to be that one..Also, keep in mind that by statistics, you have more of a chance of having something go wrong when taking medications.  Who should not have this procedure? If you are on a blood thinning medication (e.g. Coumadin, Plavix, see list of "Blood Thinners"), or if you have an active infection going on, you should not have the procedure.  If you are taking any blood thinners, please inform your physician.  How should I prepare for this procedure?  Do not eat or drink anything at least six hours prior to the procedure.  Bring a driver with you .  It cannot be a taxi.  Come accompanied by an adult that can drive you back, and that is strong enough to help you if your legs get weak or numb from the local anesthetic.  Take all of your medicines the morning of the procedure with just enough water to swallow them.  If you have diabetes, make sure that you are scheduled to have your procedure done first thing in the morning, whenever possible.  If you have diabetes, take only half of your insulin dose and notify our nurse that you have done so as soon as you arrive at the clinic.  If you are diabetic, but only take blood sugar pills (oral hypoglycemic), then do not take them on the morning of your procedure.  You may take them after you have had the procedure.  Do not take aspirin or any aspirin-containing medications, at least eleven  (11) days prior to the procedure.  They may prolong bleeding.  Wear loose fitting clothing that may be easy to take off and that you would not mind if it got stained with Betadine or blood.  Do not wear any jewelry or perfume  Remove any nail coloring.  It will interfere with some of our monitoring equipment.  NOTE: Remember that this is not meant to be interpreted as a complete list of all possible complications.  Unforeseen problems may occur.  BLOOD THINNERS The following drugs contain aspirin or other products, which can cause increased bleeding during surgery and should not be taken for 2 weeks prior to and 1 week after surgery.  If you should need take something for relief of minor pain, you may take acetaminophen which  is found in Tylenol,m Datril, Anacin-3 and Panadol. It is not blood thinner. The products listed below are.  Do not take any of the products listed below in addition to any listed on your instruction sheet.  A.P.C or A.P.C with Codeine Codeine Phosphate Capsules #3 Ibuprofen Ridaura  ABC compound Congesprin Imuran rimadil  Advil Cope Indocin Robaxisal  Alka-Seltzer Effervescent Pain Reliever and Antacid Coricidin or Coricidin-D  Indomethacin Rufen  Alka-Seltzer plus Cold Medicine Cosprin Ketoprofen S-A-C Tablets  Anacin Analgesic Tablets or Capsules Coumadin Korlgesic Salflex  Anacin Extra Strength Analgesic tablets or capsules CP-2 Tablets Lanoril Salicylate  Anaprox Cuprimine Capsules Levenox Salocol  Anexsia-D Dalteparin Magan Salsalate  Anodynos Darvon compound Magnesium Salicylate Sine-off  Ansaid Dasin Capsules Magsal Sodium Salicylate  Anturane Depen Capsules Marnal Soma  APF Arthritis pain formula Dewitt's Pills Measurin Stanback  Argesic Dia-Gesic Meclofenamic Sulfinpyrazone  Arthritis Bayer Timed Release Aspirin Diclofenac Meclomen Sulindac  Arthritis pain formula Anacin Dicumarol Medipren Supac  Analgesic (Safety coated) Arthralgen Diffunasal Mefanamic  Suprofen  Arthritis Strength Bufferin Dihydrocodeine Mepro Compound Suprol  Arthropan liquid Dopirydamole Methcarbomol with Aspirin Synalgos  ASA tablets/Enseals Disalcid Micrainin Tagament  Ascriptin Doan's Midol Talwin  Ascriptin A/D Dolene Mobidin Tanderil  Ascriptin Extra Strength Dolobid Moblgesic Ticlid  Ascriptin with Codeine Doloprin or Doloprin with Codeine Momentum Tolectin  Asperbuf Duoprin Mono-gesic Trendar  Aspergum Duradyne Motrin or Motrin IB Triminicin  Aspirin plain, buffered or enteric coated Durasal Myochrisine Trigesic  Aspirin Suppositories Easprin Nalfon Trillsate  Aspirin with Codeine Ecotrin Regular or Extra Strength Naprosyn Uracel  Atromid-S Efficin Naproxen Ursinus  Auranofin Capsules Elmiron Neocylate Vanquish  Axotal Emagrin Norgesic Verin  Azathioprine Empirin or Empirin with Codeine Normiflo Vitamin E  Azolid Emprazil Nuprin Voltaren  Bayer Aspirin plain, buffered or children's or timed BC Tablets or powders Encaprin Orgaran Warfarin Sodium  Buff-a-Comp Enoxaparin Orudis Zorpin  Buff-a-Comp with Codeine Equegesic Os-Cal-Gesic   Buffaprin Excedrin plain, buffered or Extra Strength Oxalid   Bufferin Arthritis Strength Feldene Oxphenbutazone   Bufferin plain or Extra Strength Feldene Capsules Oxycodone with Aspirin   Bufferin with Codeine Fenoprofen Fenoprofen Pabalate or Pabalate-SF   Buffets II Flogesic Panagesic   Buffinol plain or Extra Strength Florinal or Florinal with Codeine Panwarfarin   Buf-Tabs Flurbiprofen Penicillamine   Butalbital Compound Four-way cold tablets Penicillin   Butazolidin Fragmin Pepto-Bismol   Carbenicillin Geminisyn Percodan   Carna Arthritis Reliever Geopen Persantine   Carprofen Gold's salt Persistin   Chloramphenicol Goody's Phenylbutazone   Chloromycetin Haltrain Piroxlcam   Clmetidine heparin Plaquenil   Cllnoril Hyco-pap Ponstel   Clofibrate Hydroxy chloroquine Propoxyphen         Before stopping any of these  medications, be sure to consult the physician who ordered them.  Some, such as Coumadin (Warfarin) are ordered to prevent or treat serious conditions such as "deep thrombosis", "pumonary embolisms", and other heart problems.  The amount of time that you may need off of the medication may also vary with the medication and the reason for which you were taking it.  If you are taking any of these medications, please make sure you notify your pain physician before you undergo any procedures.

## 2015-03-20 NOTE — Progress Notes (Signed)
   Subjective:    Patient ID: Sherri Foster, female    DOB: 1961/01/17, 55 y.o.   MRN: [id number]  HPI  The patient is a 55 year old female who returns to pain management for further evaluation and treatment of pain involving headaches as well as pain involving the lower back and lower extremity regions. The patient admits to some generalized arthralgias and myalgias at this time which she attributes to the cold damp weather. We discussed patient's condition and patient admits to significant improvement of lower back lower extremity pain following block of nerves to the sacroiliac joint. We will continue medications as prescribed this time and remain available to consider patient for additional modification of treatment should there be change in condition. The patient was in agreement with the suggested treatment plan. The patient will continue oxycodone at this time     Review of Systems     Objective:   Physical Exam  There was tenderness of the splenius capitis and occipitalis musculatures and a mild degree with mild tenderness of the cervical facet cervical paraspinal muscular treat. Patient appeared to be with bilaterally equal grip strength and Tinel and Phalen's maneuver were without increased pain of significant degree. There was mild tenderness to palpation over the thoracic facet thoracic paraspinal musculature region with no crepitus of the thoracic region noted. Palpation over the lumbar paraspinal must reason lumbar facet region was with mild to moderate tenderness to palpation with lateral bending rotation extension and palpation of the lumbar facets reproducing mild to moderate discomfort. There was mild to moderate tenderness to palpation of the PSIS and PII S region a mild tenderness to palpation of the greater trochanteric region iliotibial band region. DTRs appeared to be trace at the knees. No sensory deficit or dermatomal distribution detected. Negative clonus negative  Homans. Straight leg raising was tolerates approximately 20 without increased pain with dorsiflexion noted. There was mild increase of pain with pressure applied to the ileum with patient in lateral decubitus position and palpation of the PSIS and PII S region. There was negative clonus negative Homans. 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 May consider pending follow-up evaluations  F/U neurological evaluation. May consider pending follow-up evaluations  May consider radiofrequency rhizolysis or intraspinal procedures pending response to present treatment and F/U evaluation   Patient to call [facility] should patient have concerns prior to scheduled return appoinment

## 2015-03-20 NOTE — Progress Notes (Signed)
Safety precautions to be maintained throughout the outpatient stay will include: orient to surroundings, keep bed in low position, maintain call bell within reach at all times, provide assistance with transfer out of bed and ambulation.   Patient here for medication refill 

## 2015-04-17 ENCOUNTER — Ambulatory Visit: Payer: Medicare Other | Attending: Pain Medicine | Admitting: Pain Medicine

## 2015-04-17 ENCOUNTER — Encounter: Payer: Self-pay | Admitting: Pain Medicine

## 2015-04-17 VITALS — BP 113/92 | HR 84 | Temp 97.7°F | Resp 15 | Ht 60.0 in | Wt 155.0 lb

## 2015-04-17 DIAGNOSIS — R51 Headache: Secondary | ICD-10-CM | POA: Diagnosis present

## 2015-04-17 DIAGNOSIS — M47816 Spondylosis without myelopathy or radiculopathy, lumbar region: Secondary | ICD-10-CM

## 2015-04-17 DIAGNOSIS — M542 Cervicalgia: Secondary | ICD-10-CM | POA: Diagnosis present

## 2015-04-17 DIAGNOSIS — M533 Sacrococcygeal disorders, not elsewhere classified: Secondary | ICD-10-CM | POA: Insufficient documentation

## 2015-04-17 DIAGNOSIS — M5136 Other intervertebral disc degeneration, lumbar region: Secondary | ICD-10-CM | POA: Diagnosis not present

## 2015-04-17 DIAGNOSIS — M47812 Spondylosis without myelopathy or radiculopathy, cervical region: Secondary | ICD-10-CM | POA: Insufficient documentation

## 2015-04-17 DIAGNOSIS — M5481 Occipital neuralgia: Secondary | ICD-10-CM | POA: Diagnosis not present

## 2015-04-17 DIAGNOSIS — M546 Pain in thoracic spine: Secondary | ICD-10-CM | POA: Diagnosis present

## 2015-04-17 MED ORDER — OXYCODONE HCL 5 MG PO TABS: ORAL_TABLET | ORAL | Status: DC

## 2015-04-17 NOTE — Progress Notes (Signed)
Safety precautions to be maintained throughout the outpatient stay will include: orient to surroundings, keep bed in low position, maintain call bell within reach at all times, provide assistance with transfer out of bed and ambulation.  

## 2015-04-17 NOTE — Progress Notes (Signed)
Subjective:    Patient ID: Yvette Rack, female    DOB: 1961/03/01, 55 y.o.   MRN: [id number]  HPI The patient is a 55 year old female who returns to pain management for further evaluation and treatment of pain involving neck headaches as well as entire back upper and lower extremity regions. The patient states that she has lower back pain radiating to the buttocks on the left as well as on the right which is aggravated by standing walking and becomes more intense as the day progresses. The patient states the pain is related time that she spends on her feet. The pain is increased by the end of the day and interferes the patient ability to obtain restful sleep as well. The patient denies any trauma change in events of daily living the call significant change in symptomatology. The patient states that headaches are fairly well-controlled at this time and that the most disabling pain involves the lower back with pain radiating to the buttocks on the left as well as on the right. We discussed patient's condition and will consider patient for block of nerves to the sacroiliac joint to be performed at time return appointment in attempt to decrease severity of patient's symptoms, minimize progression of symptoms, and avoid the need for more involved treatment. The patient was understanding and in agreement with suggested treatment plan the patient is tolerating medications well without undesirable side effects in pain has returned to significant degree despite the use of medications. We will we'll proceed with interventional treatment at time return appointment as discussed in attempt to decrease severity of symptoms, minimize progression of symptoms, and avoid the need for more involved treatment as previously mentioned      Review of Systems     Objective:   Physical Exam There was minimal tenderness of the splenius capitis and occipitalis musculature regions. Palpation of these regions  reproduced pain of minimal degree. There were no masses of the head and neck noted. Palpation over the cervical facet cervical paraspinal musculature region was with minimal discomfort. There was minimal tenderness of the acromioclavicular and glenohumeral joint region and patient was with unremarkable Spurling's maneuver. The patient appeared to be with bilaterally equal grip strength with Tinel and Phalen's maneuver reproducing minimal discomfort. Palpation over the thoracic facet thoracic paraspinal musculature region was attends to palpation of minimal degree with moderate muscle spasms of the lower thoracic paraspinal musculature region with no crepitus of the thoracic region noted. Palpation over the lumbar paraspinal musculatures and lumbar facet region was with moderately severe tenderness to palpation was severe tenderness of the PSIS and PII S region as well as the gluteal and piriformis musculature regions. There was increase of pain of moderately severe degree with pressure applied to the ileum with patient in lateral decubitus position. Straight leg raising was tolerates approximately 20 without increase of pain with dorsiflexion noted. There was negative clonus negative Homans and no definite sensory deficit of dermatomal distribution was detected. DTRs appeared to be trace at the knees. Abdomen was nontender with no costovertebral tenderness noted. The predominant portion of patient's pain was reproduced with palpation over the PSIS and P IIS region as well as increase of pain with pressure applied to the ileum with patient in lateral decubitus position. The patient also was with positive Patrick's maneuver. Abdomen was nontender with no costovertebral angle tenderness noted.       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 May consider pending follow-up evaluations  F/U neurological evaluation. May consider pending follow-up evaluations  May consider radiofrequency rhizolysis or intraspinal procedures pending response to present treatment and F/U evaluation   Patient to call [facility] should patient have concerns prior to scheduled return appoinment

## 2015-04-17 NOTE — Patient Instructions (Addendum)
PLAN   Continue present medication oxycodone   Block of nerves to the sacroiliac joint to be performed at time of return appointment  F/U PCP Dr.George for evaliation of  BP and general medical  condition  F/U surgical evaluation. May consider pending follow-up evaluations  F/U neurological evaluation. May consider pending follow-up evaluations  May consider radiofrequency rhizolysis or intraspinal procedures pending response to present treatment and F/U evaluation   Patient to call Pain Management Center should patient have concerns prior to scheduled return appoinmentPain Management Discharge Instructions  General Discharge Instructions :  If you need to reach your doctor call: Monday-Friday 8:00 am - 4:00 pm at [phone] or toll free [phone].  After clinic hours [phone] to have operator reach doctor.  Bring all of your medication bottles to all your appointments in the pain clinic.  To cancel or reschedule your appointment with Pain Management please remember to call 24 hours in advance to avoid a fee.  Refer to the educational materials which you have been given on: General Risks, I had my Procedure. Discharge Instructions, Post Sedation.  Post Procedure Instructions:  The drugs you were given will stay in your system until tomorrow, so for the next 24 hours you should not drive, make any legal decisions or drink any alcoholic beverages.  You may eat anything you prefer, but it is better to start with liquids then soups and crackers, and gradually work up to solid foods.  Please notify your doctor immediately if you have any unusual bleeding, trouble breathing or pain that is not related to your normal pain.  Depending on the type of procedure that was done, some parts of your body may feel week and/or numb.  This usually clears up by tonight or the next day.  Walk with the use of an assistive device or accompanied by an adult for the 24 hours.  You may use  ice on the affected area for the first 24 hours.  Put ice in a Ziploc bag and cover with a towel and place against area 15 minutes on 15 minutes off.  You may switch to heat after 24 hours.Sacroiliac (SI) Joint Injection Patient Information  Description: The sacroiliac joint connects the scrum (very low back and tailbone) to the ilium (a pelvic bone which also forms half of the hip joint).  Normally this joint experiences very little motion.  When this joint becomes inflamed or unstable low back and or hip and pelvis pain may result.  Injection of this joint with local anesthetics (numbing medicines) and steroids can provide diagnostic information and reduce pain.  This injection is performed with the aid of x-ray guidance into the tailbone area while you are lying on your stomach.   You may experience an electrical sensation down the leg while this is being done.  You may also experience numbness.  We also may ask if we are reproducing your normal pain during the injection.  Conditions which may be treated SI injection:   Low back, buttock, hip or leg pain  Preparation for the Injection:  1. Do not eat any solid food or dairy products within 6 hours of your appointment.  2. You may drink clear liquids up to 2 hours before appointment.  Clear liquids include water, black coffee, juice or soda.  No milk or cream please. 3. You may take your regular medications, including pain medications with a sip of water before your appointment.  Diabetics should hold regular insulin (if take separately) and  take 1/2 normal NPH dose the morning of the procedure.  Carry some sugar containing items with you to your appointment. 4. A driver must accompany you and be prepared to drive you home after your procedure. 5. Bring all of your current medications with you. 6. An IV may be inserted and sedation may be given at the discretion of the physician. 7. A blood pressure cuff, EKG and other monitors will often be  applied during the procedure.  Some patients may need to have extra oxygen administered for a short period.  8. You will be asked to provide medical information, including your allergies, prior to the procedure.  We must know immediately if you are taking blood thinners (like Coumadin/Warfarin) or if you are allergic to IV iodine contrast (dye).  We must know if you could possible be pregnant.  Possible side effects:   Bleeding from needle site  Infection (rare, may require surgery)  Nerve injury (rare)  Numbness & tingling (temporary)  A brief convulsion or seizure  Light-headedness (temporary)  Pain at injection site (several days)  Decreased blood pressure (temporary)  Weakness in the leg (temporary)   Call if you experience:   New onset weakness or numbness of an extremity below the injection site that last more than 8 hours.  Hives or difficulty breathing ( go to the emergency room)  Inflammation or drainage at the injection site  Any new symptoms which are concerning to you  Please note:  Although the local anesthetic injected can often make your back/ hip/ buttock/ leg feel good for several hours after the injections, the pain will likely return.  It takes 3-7 days for steroids to work in the sacroiliac area.  You may not notice any pain relief for at least that one week.  If effective, we will often do a series of three injections spaced 3-6 weeks apart to maximally decrease your pain.  After the initial series, we generally will wait some months before a repeat injection of the same type.  If you have any questions, please call [phone] [facility]  GENERAL RISKS AND COMPLICATIONS  What are the risk, side effects and possible complications? Generally speaking, most procedures are safe.  However, with any procedure there are risks, side effects, and the possibility of complications.  The risks and complications are  dependent upon the sites that are lesioned, or the type of nerve block to be performed.  The closer the procedure is to the spine, the more serious the risks are.  Great care is taken when placing the radio frequency needles, block needles or lesioning probes, but sometimes complications can occur. 1. Infection: Any time there is an injection through the skin, there is a risk of infection.  This is why sterile conditions are used for these blocks.  There are four possible types of infection. 1. Localized skin infection. 2. Central Nervous System Infection-This can be in the form of Meningitis, which can be deadly. 3. Epidural Infections-This can be in the form of an epidural abscess, which can cause pressure inside of the spine, causing compression of the spinal cord with subsequent paralysis. This would require an emergency surgery to decompress, and there are no guarantees that the patient would recover from the paralysis. 4. Discitis-This is an infection of the intervertebral discs.  It occurs in about 1% of discography procedures.  It is difficult to treat and it may lead to surgery.  2. Pain: the needles have to go through skin and soft tissues, will cause soreness.       3. Damage to internal structures:  The nerves to be lesioned may be near blood vessels or    other nerves which can be potentially damaged.       4. Bleeding: Bleeding is more common if the patient is taking blood thinners such as  aspirin, Coumadin, Ticiid, Plavix, etc., or if he/she have some genetic predisposition  such as hemophilia. Bleeding into the spinal canal can cause compression of the spinal  cord with subsequent paralysis.  This would require an emergency surgery to  decompress and there are no guarantees that the patient would recover from the  paralysis.       5. Pneumothorax:  Puncturing of a lung is a possibility, every time a needle is introduced in  the area of the chest or upper back.  Pneumothorax refers  to free air around the  collapsed lung(s), inside of the thoracic cavity (chest cavity).  Another two possible  complications related to a similar event would include: Hemothorax and Chylothorax.   These are variations of the Pneumothorax, where instead of air around the collapsed  lung(s), you may have blood or chyle, respectively.       6. Spinal headaches: They may occur with any procedures in the area of the spine.       7. Persistent CSF (Cerebro-Spinal Fluid) leakage: This is a rare problem, but may occur  with prolonged intrathecal or epidural catheters either due to the formation of a fistulous  track or a dural tear.       8. Nerve damage: By working so close to the spinal cord, there is always a possibility of  nerve damage, which could be as serious as a permanent spinal cord injury with  paralysis.       9. Death:  Although rare, severe deadly allergic reactions known as "Anaphylactic  reaction" can occur to any of the medications used.      10. Worsening of the symptoms:  We can always make thing worse.  What are the chances of something like this happening? Chances of any of this occuring are extremely low.  By statistics, you have more of a chance of getting killed in a motor vehicle accident: while driving to the hospital than any of the above occurring .  Nevertheless, you should be aware that they are possibilities.  In general, it is similar to taking a shower.  Everybody knows that you can slip, hit your head and get killed.  Does that mean that you should not shower again?  Nevertheless always keep in mind that statistics do not mean anything if you happen to be on the wrong side of them.  Even if a procedure has a 1 (one) in a 1,000,000 (million) chance of going wrong, it you happen to be that one..Also, keep in mind that by statistics, you have more of a chance of having something go wrong when taking medications.  Who should not have this procedure? If you are on a blood thinning  medication (e.g. Coumadin, Plavix, see list of "Blood Thinners"), or if you have an active infection going on, you should not have the procedure.  If you are taking any blood thinners, please inform your physician.  How should I prepare for this procedure?  Do not eat or drink anything at least six hours prior to the procedure.  Bring a driver  with you .  It cannot be a taxi.  Come accompanied by an adult that can drive you back, and that is strong enough to help you if your legs get weak or numb from the local anesthetic.  Take all of your medicines the morning of the procedure with just enough water to swallow them.  If you have diabetes, make sure that you are scheduled to have your procedure done first thing in the morning, whenever possible.  If you have diabetes, take only half of your insulin dose and notify our nurse that you have done so as soon as you arrive at the clinic.  If you are diabetic, but only take blood sugar pills (oral hypoglycemic), then do not take them on the morning of your procedure.  You may take them after you have had the procedure.  Do not take aspirin or any aspirin-containing medications, at least eleven (11) days prior to the procedure.  They may prolong bleeding.  Wear loose fitting clothing that may be easy to take off and that you would not mind if it got stained with Betadine or blood.  Do not wear any jewelry or perfume  Remove any nail coloring.  It will interfere with some of our monitoring equipment.  NOTE: Remember that this is not meant to be interpreted as a complete list of all possible complications.  Unforeseen problems may occur.  BLOOD THINNERS The following drugs contain aspirin or other products, which can cause increased bleeding during surgery and should not be taken for 2 weeks prior to and 1 week after surgery.  If you should need take something for relief of minor pain, you may take acetaminophen which is found in Tylenol,m  Datril, Anacin-3 and Panadol. It is not blood thinner. The products listed below are.  Do not take any of the products listed below in addition to any listed on your instruction sheet.  A.P.C or A.P.C with Codeine Codeine Phosphate Capsules #3 Ibuprofen Ridaura  ABC compound Congesprin Imuran rimadil  Advil Cope Indocin Robaxisal  Alka-Seltzer Effervescent Pain Reliever and Antacid Coricidin or Coricidin-D  Indomethacin Rufen  Alka-Seltzer plus Cold Medicine Cosprin Ketoprofen S-A-C Tablets  Anacin Analgesic Tablets or Capsules Coumadin Korlgesic Salflex  Anacin Extra Strength Analgesic tablets or capsules CP-2 Tablets Lanoril Salicylate  Anaprox Cuprimine Capsules Levenox Salocol  Anexsia-D Dalteparin Magan Salsalate  Anodynos Darvon compound Magnesium Salicylate Sine-off  Ansaid Dasin Capsules Magsal Sodium Salicylate  Anturane Depen Capsules Marnal Soma  APF Arthritis pain formula Dewitt's Pills Measurin Stanback  Argesic Dia-Gesic Meclofenamic Sulfinpyrazone  Arthritis Bayer Timed Release Aspirin Diclofenac Meclomen Sulindac  Arthritis pain formula Anacin Dicumarol Medipren Supac  Analgesic (Safety coated) Arthralgen Diffunasal Mefanamic Suprofen  Arthritis Strength Bufferin Dihydrocodeine Mepro Compound Suprol  Arthropan liquid Dopirydamole Methcarbomol with Aspirin Synalgos  ASA tablets/Enseals Disalcid Micrainin Tagament  Ascriptin Doan's Midol Talwin  Ascriptin A/D Dolene Mobidin Tanderil  Ascriptin Extra Strength Dolobid Moblgesic Ticlid  Ascriptin with Codeine Doloprin or Doloprin with Codeine Momentum Tolectin  Asperbuf Duoprin Mono-gesic Trendar  Aspergum Duradyne Motrin or Motrin IB Triminicin  Aspirin plain, buffered or enteric coated Durasal Myochrisine Trigesic  Aspirin Suppositories Easprin Nalfon Trillsate  Aspirin with Codeine Ecotrin Regular or Extra Strength Naprosyn Uracel  Atromid-S Efficin Naproxen Ursinus  Auranofin Capsules Elmiron Neocylate Vanquish   Axotal Emagrin Norgesic Verin  Azathioprine Empirin or Empirin with Codeine Normiflo Vitamin E  Azolid Emprazil Nuprin Voltaren  Bayer Aspirin plain, buffered or children's or timed BC Tablets or powders  Encaprin Orgaran Warfarin Sodium  Buff-a-Comp Enoxaparin Orudis Zorpin  Buff-a-Comp with Codeine Equegesic Os-Cal-Gesic   Buffaprin Excedrin plain, buffered or Extra Strength Oxalid   Bufferin Arthritis Strength Feldene Oxphenbutazone   Bufferin plain or Extra Strength Feldene Capsules Oxycodone with Aspirin   Bufferin with Codeine Fenoprofen Fenoprofen Pabalate or Pabalate-SF   Buffets II Flogesic Panagesic   Buffinol plain or Extra Strength Florinal or Florinal with Codeine Panwarfarin   Buf-Tabs Flurbiprofen Penicillamine   Butalbital Compound Four-way cold tablets Penicillin   Butazolidin Fragmin Pepto-Bismol   Carbenicillin Geminisyn Percodan   Carna Arthritis Reliever Geopen Persantine   Carprofen Gold's salt Persistin   Chloramphenicol Goody's Phenylbutazone   Chloromycetin Haltrain Piroxlcam   Clmetidine heparin Plaquenil   Cllnoril Hyco-pap Ponstel   Clofibrate Hydroxy chloroquine Propoxyphen         Before stopping any of these medications, be sure to consult the physician who ordered them.  Some, such as Coumadin (Warfarin) are ordered to prevent or treat serious conditions such as "deep thrombosis", "pumonary embolisms", and other heart problems.  The amount of time that you may need off of the medication may also vary with the medication and the reason for which you were taking it.  If you are taking any of these medications, please make sure you notify your pain physician before you undergo any procedures.

## 2015-05-14 ENCOUNTER — Encounter: Payer: Self-pay | Admitting: Pain Medicine

## 2015-05-14 ENCOUNTER — Ambulatory Visit: Payer: Medicare Other | Attending: Pain Medicine | Admitting: Pain Medicine

## 2015-05-14 VITALS — BP 126/87 | HR 63 | Temp 98.0°F | Resp 16 | Ht 60.0 in | Wt 152.0 lb

## 2015-05-14 DIAGNOSIS — M545 Low back pain: Secondary | ICD-10-CM | POA: Insufficient documentation

## 2015-05-14 DIAGNOSIS — M533 Sacrococcygeal disorders, not elsewhere classified: Secondary | ICD-10-CM

## 2015-05-14 DIAGNOSIS — M5136 Other intervertebral disc degeneration, lumbar region: Secondary | ICD-10-CM

## 2015-05-14 DIAGNOSIS — M79606 Pain in leg, unspecified: Secondary | ICD-10-CM | POA: Diagnosis present

## 2015-05-14 DIAGNOSIS — M5481 Occipital neuralgia: Secondary | ICD-10-CM

## 2015-05-14 DIAGNOSIS — M791 Myalgia: Secondary | ICD-10-CM | POA: Diagnosis present

## 2015-05-14 DIAGNOSIS — M47816 Spondylosis without myelopathy or radiculopathy, lumbar region: Secondary | ICD-10-CM | POA: Insufficient documentation

## 2015-05-14 MED ORDER — ORPHENADRINE CITRATE 30 MG/ML IJ SOLN
INTRAMUSCULAR | Status: AC
  Administered 2015-05-14: 08:00:00
  Filled 2015-05-14: qty 2

## 2015-05-14 MED ORDER — LACTATED RINGERS IV SOLN: 1000.0000 mL | INTRAVENOUS | Status: DC

## 2015-05-14 MED ORDER — MIDAZOLAM HCL 5 MG/5ML IJ SOLN: 5.0000 mg | Freq: Once | INTRAMUSCULAR | Status: DC

## 2015-05-14 MED ORDER — FENTANYL CITRATE (PF) 100 MCG/2ML IJ SOLN
INTRAMUSCULAR | Status: AC
  Administered 2015-05-14: 100 ug via INTRAVENOUS
  Filled 2015-05-14: qty 2

## 2015-05-14 MED ORDER — FENTANYL CITRATE (PF) 100 MCG/2ML IJ SOLN: 100.0000 ug | Freq: Once | INTRAMUSCULAR | Status: DC

## 2015-05-14 MED ORDER — TRIAMCINOLONE ACETONIDE 40 MG/ML IJ SUSP: 40.0000 mg | Freq: Once | INTRAMUSCULAR | Status: DC

## 2015-05-14 MED ORDER — MIDAZOLAM HCL 5 MG/5ML IJ SOLN
INTRAMUSCULAR | Status: AC
  Administered 2015-05-14: 3 mg via INTRAVENOUS
  Filled 2015-05-14: qty 5

## 2015-05-14 MED ORDER — ORPHENADRINE CITRATE 30 MG/ML IJ SOLN: 60.0000 mg | Freq: Once | INTRAMUSCULAR | Status: DC

## 2015-05-14 MED ORDER — TRIAMCINOLONE ACETONIDE 40 MG/ML IJ SUSP
INTRAMUSCULAR | Status: AC
  Administered 2015-05-14: 08:00:00
  Filled 2015-05-14: qty 1

## 2015-05-14 MED ORDER — OXYCODONE HCL 5 MG PO TABS: ORAL_TABLET | ORAL | Status: DC

## 2015-05-14 MED ORDER — BUPIVACAINE HCL (PF) 0.25 % IJ SOLN: 30.0000 mL | Freq: Once | INTRAMUSCULAR | Status: DC

## 2015-05-14 MED ORDER — BUPIVACAINE HCL (PF) 0.25 % IJ SOLN
INTRAMUSCULAR | Status: AC
  Administered 2015-05-14: 08:00:00
  Filled 2015-05-14: qty 30

## 2015-05-14 NOTE — Patient Instructions (Addendum)
PLAN   Continue present medication oxycodone.  F/U PCP Dr. Greggory Stallion for evaliation of  BP and general medical  condition. Have patient see Dr. Greggory Stallion for elevated blood pressure today or this week  F/U  GI evaluation.  F/U surgical evaluation.  F/U nrurological evaluation.  May consider radiofrequency rhizolysis or intraspinal procedures pending response to present treatment and F/U evaluation.  Patient to call [facility] should patient have concerns prior to scheduled return appointment. Pain Management Discharge Instructions  General Discharge Instructions :  If you need to reach your doctor call: Monday-Friday 8:00 am - 4:00 pm at [phone] or toll free [phone].  After clinic hours [phone] to have operator reach doctor.  Bring all of your medication bottles to all your appointments in the pain clinic.  To cancel or reschedule your appointment with Pain Management please remember to call 24 hours in advance to avoid a fee.  Refer to the educational materials which you have been given on: General Risks, I had my Procedure. Discharge Instructions, Post Sedation.  Post Procedure Instructions:  The drugs you were given will stay in your system until tomorrow, so for the next 24 hours you should not drive, make any legal decisions or drink any alcoholic beverages.  You may eat anything you prefer, but it is better to start with liquids then soups and crackers, and gradually work up to solid foods.  Please notify your doctor immediately if you have any unusual bleeding, trouble breathing or pain that is not related to your normal pain.  Depending on the type of procedure that was done, some parts of your body may feel week and/or numb.  This usually clears up by tonight or the next day.  Walk with the use of an assistive device or accompanied by an adult for the 24 hours.  You may use ice on the affected area for the first 24 hours.  Put ice in a Ziploc bag  and cover with a towel and place against area 15 minutes on 15 minutes off.  You may switch to heat after 24 hours.GENERAL RISKS AND COMPLICATIONS  What are the risk, side effects and possible complications? Generally speaking, most procedures are safe.  However, with any procedure there are risks, side effects, and the possibility of complications.  The risks and complications are dependent upon the sites that are lesioned, or the type of nerve block to be performed.  The closer the procedure is to the spine, the more serious the risks are.  Great care is taken when placing the radio frequency needles, block needles or lesioning probes, but sometimes complications can occur. 1. Infection: Any time there is an injection through the skin, there is a risk of infection.  This is why sterile conditions are used for these blocks.  There are four possible types of infection. 1. Localized skin infection. 2. Central Nervous System Infection-This can be in the form of Meningitis, which can be deadly. 3. Epidural Infections-This can be in the form of an epidural abscess, which can cause pressure inside of the spine, causing compression of the spinal cord with subsequent paralysis. This would require an emergency surgery to decompress, and there are no guarantees that the patient would recover from the paralysis. 4. Discitis-This is an infection of the intervertebral discs.  It occurs in about 1% of discography procedures.  It is difficult to treat and it may lead to surgery.        2. Pain: the needles have  to go through skin and soft tissues, will cause soreness.       3. Damage to internal structures:  The nerves to be lesioned may be near blood vessels or    other nerves which can be potentially damaged.       4. Bleeding: Bleeding is more common if the patient is taking blood thinners such as  aspirin, Coumadin, Ticiid, Plavix, etc., or if he/she have some genetic predisposition  such as hemophilia. Bleeding  into the spinal canal can cause compression of the spinal  cord with subsequent paralysis.  This would require an emergency surgery to  decompress and there are no guarantees that the patient would recover from the  paralysis.       5. Pneumothorax:  Puncturing of a lung is a possibility, every time a needle is introduced in  the area of the chest or upper back.  Pneumothorax refers to free air around the  collapsed lung(s), inside of the thoracic cavity (chest cavity).  Another two possible  complications related to a similar event would include: Hemothorax and Chylothorax.   These are variations of the Pneumothorax, where instead of air around the collapsed  lung(s), you may have blood or chyle, respectively.       6. Spinal headaches: They may occur with any procedures in the area of the spine.       7. Persistent CSF (Cerebro-Spinal Fluid) leakage: This is a rare problem, but may occur  with prolonged intrathecal or epidural catheters either due to the formation of a fistulous  track or a dural tear.       8. Nerve damage: By working so close to the spinal cord, there is always a possibility of  nerve damage, which could be as serious as a permanent spinal cord injury with  paralysis.       9. Death:  Although rare, severe deadly allergic reactions known as "Anaphylactic  reaction" can occur to any of the medications used.      10. Worsening of the symptoms:  We can always make thing worse.  What are the chances of something like this happening? Chances of any of this occuring are extremely low.  By statistics, you have more of a chance of getting killed in a motor vehicle accident: while driving to the hospital than any of the above occurring .  Nevertheless, you should be aware that they are possibilities.  In general, it is similar to taking a shower.  Everybody knows that you can slip, hit your head and get killed.  Does that mean that you should not shower again?  Nevertheless always keep in mind  that statistics do not mean anything if you happen to be on the wrong side of them.  Even if a procedure has a 1 (one) in a 1,000,000 (million) chance of going wrong, it you happen to be that one..Also, keep in mind that by statistics, you have more of a chance of having something go wrong when taking medications.  Who should not have this procedure? If you are on a blood thinning medication (e.g. Coumadin, Plavix, see list of "Blood Thinners"), or if you have an active infection going on, you should not have the procedure.  If you are taking any blood thinners, please inform your physician.  How should I prepare for this procedure?  Do not eat or drink anything at least six hours prior to the procedure.  Bring a driver with you .  It  cannot be a taxi.  Come accompanied by an adult that can drive you back, and that is strong enough to help you if your legs get weak or numb from the local anesthetic.  Take all of your medicines the morning of the procedure with just enough water to swallow them.  If you have diabetes, make sure that you are scheduled to have your procedure done first thing in the morning, whenever possible.  If you have diabetes, take only half of your insulin dose and notify our nurse that you have done so as soon as you arrive at the clinic.  If you are diabetic, but only take blood sugar pills (oral hypoglycemic), then do not take them on the morning of your procedure.  You may take them after you have had the procedure.  Do not take aspirin or any aspirin-containing medications, at least eleven (11) days prior to the procedure.  They may prolong bleeding.  Wear loose fitting clothing that may be easy to take off and that you would not mind if it got stained with Betadine or blood.  Do not wear any jewelry or perfume  Remove any nail coloring.  It will interfere with some of our monitoring equipment.  NOTE: Remember that this is not meant to be interpreted as a  complete list of all possible complications.  Unforeseen problems may occur.  BLOOD THINNERS The following drugs contain aspirin or other products, which can cause increased bleeding during surgery and should not be taken for 2 weeks prior to and 1 week after surgery.  If you should need take something for relief of minor pain, you may take acetaminophen which is found in Tylenol,m Datril, Anacin-3 and Panadol. It is not blood thinner. The products listed below are.  Do not take any of the products listed below in addition to any listed on your instruction sheet.  A.P.C or A.P.C with Codeine Codeine Phosphate Capsules #3 Ibuprofen Ridaura  ABC compound Congesprin Imuran rimadil  Advil Cope Indocin Robaxisal  Alka-Seltzer Effervescent Pain Reliever and Antacid Coricidin or Coricidin-D  Indomethacin Rufen  Alka-Seltzer plus Cold Medicine Cosprin Ketoprofen S-A-C Tablets  Anacin Analgesic Tablets or Capsules Coumadin Korlgesic Salflex  Anacin Extra Strength Analgesic tablets or capsules CP-2 Tablets Lanoril Salicylate  Anaprox Cuprimine Capsules Levenox Salocol  Anexsia-D Dalteparin Magan Salsalate  Anodynos Darvon compound Magnesium Salicylate Sine-off  Ansaid Dasin Capsules Magsal Sodium Salicylate  Anturane Depen Capsules Marnal Soma  APF Arthritis pain formula Dewitt's Pills Measurin Stanback  Argesic Dia-Gesic Meclofenamic Sulfinpyrazone  Arthritis Bayer Timed Release Aspirin Diclofenac Meclomen Sulindac  Arthritis pain formula Anacin Dicumarol Medipren Supac  Analgesic (Safety coated) Arthralgen Diffunasal Mefanamic Suprofen  Arthritis Strength Bufferin Dihydrocodeine Mepro Compound Suprol  Arthropan liquid Dopirydamole Methcarbomol with Aspirin Synalgos  ASA tablets/Enseals Disalcid Micrainin Tagament  Ascriptin Doan's Midol Talwin  Ascriptin A/D Dolene Mobidin Tanderil  Ascriptin Extra Strength Dolobid Moblgesic Ticlid  Ascriptin with Codeine Doloprin or Doloprin with Codeine  Momentum Tolectin  Asperbuf Duoprin Mono-gesic Trendar  Aspergum Duradyne Motrin or Motrin IB Triminicin  Aspirin plain, buffered or enteric coated Durasal Myochrisine Trigesic  Aspirin Suppositories Easprin Nalfon Trillsate  Aspirin with Codeine Ecotrin Regular or Extra Strength Naprosyn Uracel  Atromid-S Efficin Naproxen Ursinus  Auranofin Capsules Elmiron Neocylate Vanquish  Axotal Emagrin Norgesic Verin  Azathioprine Empirin or Empirin with Codeine Normiflo Vitamin E  Azolid Emprazil Nuprin Voltaren  Bayer Aspirin plain, buffered or children's or timed BC Tablets or powders Encaprin Orgaran Warfarin Sodium  Buff-a-Comp Enoxaparin Orudis Zorpin  Buff-a-Comp with Codeine Equegesic Os-Cal-Gesic   Buffaprin Excedrin plain, buffered or Extra Strength Oxalid   Bufferin Arthritis Strength Feldene Oxphenbutazone   Bufferin plain or Extra Strength Feldene Capsules Oxycodone with Aspirin   Bufferin with Codeine Fenoprofen Fenoprofen Pabalate or Pabalate-SF   Buffets II Flogesic Panagesic   Buffinol plain or Extra Strength Florinal or Florinal with Codeine Panwarfarin   Buf-Tabs Flurbiprofen Penicillamine   Butalbital Compound Four-way cold tablets Penicillin   Butazolidin Fragmin Pepto-Bismol   Carbenicillin Geminisyn Percodan   Carna Arthritis Reliever Geopen Persantine   Carprofen Gold's salt Persistin   Chloramphenicol Goody's Phenylbutazone   Chloromycetin Haltrain Piroxlcam   Clmetidine heparin Plaquenil   Cllnoril Hyco-pap Ponstel   Clofibrate Hydroxy chloroquine Propoxyphen         Before stopping any of these medications, be sure to consult the physician who ordered them.  Some, such as Coumadin (Warfarin) are ordered to prevent or treat serious conditions such as "deep thrombosis", "pumonary embolisms", and other heart problems.  The amount of time that you may need off of the medication may also vary with the medication and the reason for which you were taking it.  If you are  taking any of these medications, please make sure you notify your pain physician before you undergo any procedures.  Oxycodone prescription given.

## 2015-05-14 NOTE — Progress Notes (Signed)
Safety precautions to be maintained throughout the outpatient stay will include: orient to surroundings, keep bed in low position, maintain call bell within reach at all times, provide assistance with transfer out of bed and ambulation.  

## 2015-05-14 NOTE — Progress Notes (Signed)
Subjective:    Patient ID: Sherri Foster, female    DOB: 09/05/1960, 55 y.o.   MRN: [id number]  HPI PROCEDURE:  Block of nerves to the sacroiliac joint.   NOTE:  The patient is a 55 y.o. female who returns to the [facility] for further evaluation and treatment of pain involving the lower back and lower extremity region with pain in the region of the buttocks as well. 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  the patient is with reproduction of severe pain with palpation over the PSIS and PII S regions.  There is concern regarding a significant component of the patient's pain being due to sacroiliac joint dysfunction The risks, benefits, expectations of the procedure have been discussed and explained to the patient who is understanding and willing to proceed with interventional treatment in attempt to decrease severity of patient's symptoms, minimize the risk of medication escalation and  hopefully retard the progression of the patient's symptoms. We will proceed with what is felt to be a medically necessary procedure, block of nerves to the sacroiliac joint.   DESCRIPTION OF PROCEDURE:  Block of nerves to the sacroiliac joint.   The patient was taken to the fluoroscopy suite. With the patient in the prone position with EKG, blood pressure, pulse and pulse oximetry monitoring, IV Versed, IV fentanyl conscious sedation, Betadine prep of proposed entry site was performed.   Block of nerves at the L5 vertebral body level.   With the patient in prone position, under fluoroscopic guidance, a 22 -gauge needle was inserted at the L5 vertebral body level on the left side. With 15 degrees oblique orientation a 22 -gauge needle was inserted in the region known as Burton's eye or eye of the Scotty dog. Following documentation of needle placement in the  area of Burton's eye or eye of the Scotty dog under fluoroscopic guidance, needle placement was then accomplished at the sacral ala level on the left side.   Needle placement at the sacral ala.   With the patient in prone position under fluoroscopic guidance with AP view of the lumbosacral spine, a 22 -gauge needle was inserted in the region known as the sacral ala on the left side. Following documentation of needle placement on the left side under fluoroscopic guidance needle placement was then accomplished at the S1 foramen level.   Needle placement at the S1 foramen level.   With the patient in prone position under fluoroscopic guidance with AP view of the lumbosacral spine and cephalad orientation, a 22 -gauge needle was inserted at the superior and lateral border of the S1 foramen on the left side. Following documentation of needle placement at the S1 foramen level on the left side, needle placement was then accomplished at the S2 foramen level on the left side.   Needle placement at the S2 foramen level.   With the patient in prone position with AP view of the lumbosacral spine with cephalad orientation, a 22 - gauge needle was inserted at the superior and lateral border of the S2 foramen under fluoroscopic guidance on the left side. Following needle placement at the L5 vertebral body level, sacral ala, S1 foramen and S2 foramen on the left side, needle placement was verified on lateral view under fluoroscopic guidance.  Following needle placement documentation on lateral view, each needle was injected with 1 mL of 0.25% bupivacaine and  Kenalog.   BLOCK OF THE NERVES TO SACROILIAC JOINT ON THE RIGHT SIDE The procedure was performed on the right side at the same levels as was performed on the left side and utilizing the same technique as on the left side and was performed under fluoroscopic guidance as on the left side   A total of 10mg  of Kenalog was utilized for the procedure.    PLAN:  1. Medications: The patient will continue presently prescribed medication oxycodone 2. The patient will be considered for modification of treatment regimen pending response to the procedure performed on today's visit.  3. The patient is to follow-up with primary care physician Dr. Greggory StallionGeorge for evaluation of blood pressure and general medical condition following the procedure performed on today's visit.  4. Surgical evaluation as discussed.  5. Neurological evaluation as discussed.  6. The patient may be a candidate for radiofrequency procedures, implantation devices and other treatment pending response to treatment performed on today's visit and follow-up evaluation.  7. The patient has been advised to adhere to proper body mechanics and to avoid activities which may exacerbate the patient's symptoms.   Return appointment to [facility] Center as scheduled.     Review of Systems     Objective:   Physical Exam        Assessment & Plan:

## 2015-05-15 ENCOUNTER — Telehealth: Payer: Self-pay | Admitting: *Deleted

## 2015-05-15 NOTE — Telephone Encounter (Signed)
Attempted to call patient for post-procedure follow-up. This number has been disconnected.

## 2015-05-18 DIAGNOSIS — Z9229 Personal history of other drug therapy: Secondary | ICD-10-CM | POA: Insufficient documentation

## 2015-05-20 DIAGNOSIS — F3341 Major depressive disorder, recurrent, in partial remission: Secondary | ICD-10-CM | POA: Insufficient documentation

## 2015-05-20 DIAGNOSIS — F319 Bipolar disorder, unspecified: Secondary | ICD-10-CM | POA: Insufficient documentation

## 2015-06-09 ENCOUNTER — Encounter: Payer: Self-pay | Admitting: Emergency Medicine

## 2015-06-09 ENCOUNTER — Emergency Department
Admission: EM | Admit: 2015-06-09 | Discharge: 2015-06-10 | Disposition: A | Discharge status: Home / Self Care | Payer: Medicare Other | Attending: Emergency Medicine | Admitting: Emergency Medicine

## 2015-06-09 DIAGNOSIS — I10 Essential (primary) hypertension: Secondary | ICD-10-CM | POA: Insufficient documentation

## 2015-06-09 DIAGNOSIS — E860 Dehydration: Secondary | ICD-10-CM

## 2015-06-09 DIAGNOSIS — F17210 Nicotine dependence, cigarettes, uncomplicated: Secondary | ICD-10-CM | POA: Diagnosis not present

## 2015-06-09 DIAGNOSIS — Z7982 Long term (current) use of aspirin: Secondary | ICD-10-CM | POA: Diagnosis not present

## 2015-06-09 DIAGNOSIS — Z79899 Other long term (current) drug therapy: Secondary | ICD-10-CM | POA: Diagnosis not present

## 2015-06-09 DIAGNOSIS — Z7951 Long term (current) use of inhaled steroids: Secondary | ICD-10-CM | POA: Insufficient documentation

## 2015-06-09 DIAGNOSIS — R1110 Vomiting, unspecified: Secondary | ICD-10-CM | POA: Diagnosis present

## 2015-06-09 LAB — COMPREHENSIVE METABOLIC PANEL
ALK PHOS: 85 U/L (ref 38–126)
ALT: 14 U/L (ref 14–54)
ANION GAP: 8 (ref 5–15)
AST: 20 U/L (ref 15–41)
Albumin: 4 g/dL (ref 3.5–5.0)
BILIRUBIN TOTAL: 0.6 mg/dL (ref 0.3–1.2)
BUN: 6 mg/dL (ref 6–20)
CALCIUM: 9.3 mg/dL (ref 8.9–10.3)
CO2: 24 mmol/L (ref 22–32)
Chloride: 106 mmol/L (ref 101–111)
Creatinine, Ser: 0.81 mg/dL (ref 0.44–1.00)
GFR calc non Af Amer: >60 mL/min (ref >60)
Glucose, Bld: 116 mg/dL — ABNORMAL HIGH (ref 65–99)
Potassium: 3.6 mmol/L (ref 3.5–5.1)
SODIUM: 138 mmol/L (ref 135–145)
TOTAL PROTEIN: 7.3 g/dL (ref 6.5–8.1)

## 2015-06-09 LAB — CBC
HCT: 48.6 % — ABNORMAL HIGH (ref 35.0–47.0)
HEMOGLOBIN: 16.1 g/dL — AB (ref 12.0–16.0)
MCH: 30.3 pg (ref 26.0–34.0)
MCHC: 33.1 g/dL (ref 32.0–36.0)
MCV: 91.6 fL (ref 80.0–100.0)
Platelets: 348 10*3/uL (ref 150–440)
RBC: 5.31 MIL/uL — AB (ref 3.80–5.20)
RDW: 13 % (ref 11.5–14.5)
WBC: 14.7 10*3/uL — AB (ref 3.6–11.0)

## 2015-06-09 LAB — LIPASE, BLOOD: LIPASE: 12 U/L (ref 11–51)

## 2015-06-09 MED ORDER — ONDANSETRON 4 MG PO TBDP
4.0000 mg | ORAL_TABLET | Freq: Once | ORAL | Status: AC | PRN
  Administered 2015-06-09: 4 mg via ORAL
  Filled 2015-06-09: qty 1

## 2015-06-09 NOTE — ED Notes (Signed)
Spoke w/pt about need to recollect urine for UA. Pt states she is currently unable to void unless she gets something to drink.  Explained to pt NPO status wrt N/V/D sx until cleared by MD.  Zofran given for nausea. Awaiting EDP.

## 2015-06-09 NOTE — ED Notes (Signed)
Pt states feels dehydrated x 3 days. Low abd pain on and off

## 2015-06-10 DIAGNOSIS — E860 Dehydration: Secondary | ICD-10-CM | POA: Diagnosis not present

## 2015-06-10 LAB — URINALYSIS COMPLETE WITH MICROSCOPIC (ARMC ONLY)
Bilirubin Urine: NEGATIVE
Glucose, UA: NEGATIVE mg/dL
Hgb urine dipstick: NEGATIVE
LEUKOCYTES UA: NEGATIVE
Nitrite: NEGATIVE
PROTEIN: 30 mg/dL — AB
SPECIFIC GRAVITY, URINE: 1.029 (ref 1.005–1.030)
pH: 5 (ref 5.0–8.0)

## 2015-06-10 MED ORDER — DEXTROSE-NACL 5-0.45 % IV SOLN
INTRAVENOUS | Status: DC
  Administered 2015-06-10: 01:00:00 via INTRAVENOUS

## 2015-06-10 MED ORDER — ONDANSETRON HCL 4 MG/2ML IJ SOLN
4.0000 mg | Freq: Once | INTRAMUSCULAR | Status: AC
  Administered 2015-06-10: 4 mg via INTRAVENOUS
  Filled 2015-06-10: qty 2

## 2015-06-10 NOTE — ED Provider Notes (Signed)
[facility] Emergency Department Provider Note  ____________________________________________  Time seen: Approximately 12:03 AM  I have reviewed the triage vital signs and the nursing notes.   HISTORY  Chief Complaint Dehydration    HPI Sherri Foster is a 55 y.o. female who presents to the ED from home with a chief complaint of dehydration. Patient states she has felt dehydrated for the past 3 days. She has had some mild vomiting and diarrhea. Complains of lower abdominal pain intermittently. She has been out in the sun and has a mild sunburn to her face. Denies fever, chills, chest pain, shortness of breath. Denies recent travel or trauma. Nothing makes her symptoms better or worse.   Past Medical History  Diagnosis Date  . Depression   . Opiate use     long term use of opiate analgesics.  . Hepatitis C   . Back pain   . Seizures (HCC)   . COPD (chronic obstructive pulmonary disease) (HCC)   . Bipolar 1 disorder (HCC)   . Hypertension   . Allergy   . Arthritis   . Blood transfusion without reported diagnosis   . Hyperlipidemia     Patient Active Problem List   Diagnosis Date Noted  . PTSD (post-traumatic stress disorder) 10/19/2014  . 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 DDD (degenerative disc disease), lumbar 07/20/2014  . Sacroiliac joint disease 07/20/2014  . Bilateral occipital neuralgia 07/20/2014  . Facet syndrome, lumbar 07/20/2014    Past Surgical History  Procedure Laterality Date  . Abdominal hysterectomy      Current Outpatient Rx  Name  Route  Sig  Dispense  Refill  . albuterol (PROVENTIL HFA;VENTOLIN HFA) 108 (90 BASE) MCG/ACT inhaler   Inhalation   Inhale 2 puffs into the lungs every 6 (six) hours as needed for wheezing or shortness of breath.         Marland Kitchen. aspirin EC 81 MG tablet   Oral   Take 81 mg by mouth daily.         . cetirizine (ZYRTEC) 10 MG tablet   Oral   Take 10 mg by mouth daily.          Marland Kitchen. EPINEPHrine 0.3 mg/0.3 mL IJ SOAJ injection   Intramuscular   Inject 0.3 mg into the muscle once as needed (for severe allergic reaction).          Marland Kitchen. FLUoxetine (PROZAC) 20 MG capsule   Oral   Take 20 mg by mouth daily.         . fluticasone (FLONASE) 50 MCG/ACT nasal spray   Each Nare   Place 2 sprays into both nostrils daily.         . Fluticasone-Salmeterol (ADVAIR) 250-50 MCG/DOSE AEPB   Inhalation   Inhale 1 puff into the lungs every 12 (twelve) hours.         . gabapentin (NEURONTIN) 300 MG capsule   Oral   Take 300 mg by mouth 3 (three) times daily.         . hydrochlorothiazide (HYDRODIURIL) 12.5 MG tablet   Oral   Take 6.25 mg by mouth daily.          Marland Kitchen. ipratropium-albuterol (DUONEB) 0.5-2.5 (3) MG/3ML SOLN   Nebulization   Take 3 mLs by nebulization every 4 (four) hours as needed (for shortness of breath).          . levETIRAcetam (KEPPRA) 500 MG tablet   Oral   Take 500 mg  by mouth 2 (two) times daily.         . nicotine (NICODERM CQ - DOSED IN MG/24 HOURS) 21 mg/24hr patch   Transdermal   Place 21 mg onto the skin daily.         Marland Kitchen oxyCODONE (ROXICODONE) 5 MG immediate release tablet      Limit 1 tab by mouth per day or 2-3 times per day if tolerated   90 tablet   0   . simvastatin (ZOCOR) 40 MG tablet   Oral   Take 40 mg by mouth at bedtime.          Marland Kitchen tiZANidine (ZANAFLEX) 2 MG tablet   Oral   Take 2 mg by mouth 2 (two) times daily.         . traZODone (DESYREL) 150 MG tablet   Oral   Take 150 mg by mouth at bedtime.           Allergies Amitriptyline; Potassium-containing compounds; Topical sulfur; Topiramate; Aspirin; Codeine; and Tylenol  Family History  Problem Relation Age of Onset  . Arthritis Mother   . Diabetes Mother   . Hyperlipidemia Mother   . Hypertension Mother   . Miscarriages / [place] Mother     Social History Social History  Substance Use Topics  . Smoking status: Current Every Day  Smoker -- 0.30 packs/day    Types: Cigarettes  . Smokeless tobacco: None  . Alcohol Use: No    Review of Systems  Constitutional: Positive for dehydration. No fever/chills. Eyes: No visual changes. ENT: No sore throat. Cardiovascular: Denies chest pain. Respiratory: Denies shortness of breath. Gastrointestinal: Positive for abdominal pain, nausea, vomiting and diarrhea.  No constipation. Genitourinary: Negative for dysuria. Musculoskeletal: Negative for back pain. Skin: Negative for rash. Neurological: Negative for headaches, focal weakness or numbness.  10-point ROS otherwise negative.  ____________________________________________   PHYSICAL EXAM:  VITAL SIGNS: ED Triage Vitals  Enc Vitals Group     BP 06/09/15 2201 143/121 mmHg     Pulse Rate 06/09/15 2201 109     Resp 06/09/15 2201 17     Temp 06/09/15 2201 97.9 F (36.6 C)     Temp Source 06/09/15 2201 Oral     SpO2 06/09/15 2201 95 %     Weight 06/09/15 2201 145 lb (65.772 kg)     Height 06/09/15 2201 5' (1.524 m)     Head Cir --      Peak Flow --      Pain Score 06/09/15 2221 2     Pain Loc --      Pain Edu? --      Excl. in GC? --     Constitutional: Alert and oriented. Well appearing and in mild acute distress. Eyes: Conjunctivae are normal. PERRL. EOMI. Head: Atraumatic. First degree sunburn to cheeks on face. Nose: No congestion/rhinnorhea. Mouth/Throat: Mucous membranes are moist.  Oropharynx non-erythematous. Neck: No stridor.  No carotid bruits. Cardiovascular: Normal rate, regular rhythm. Grossly normal heart sounds.  Good peripheral circulation. Respiratory: Normal respiratory effort.  No retractions. Lungs CTAB. Gastrointestinal: Soft and nontender. No distention. No abdominal bruits. No CVA tenderness. Musculoskeletal: No lower extremity tenderness nor edema.  No joint effusions. Neurologic:  Normal speech and language. No gross focal neurologic deficits are appreciated. No gait  instability. Skin:  Skin is warm, dry and intact. No rash noted. Psychiatric: Mood and affect are normal. Speech and behavior are normal.  ____________________________________________   LABS (all labs ordered are  listed, but only abnormal results are displayed)  Labs Reviewed  COMPREHENSIVE METABOLIC PANEL - Abnormal; Notable for the following:    Glucose, Bld 116 (*)    All other components within normal limits  CBC - Abnormal; Notable for the following:    WBC 14.7 (*)    RBC 5.31 (*)    Hemoglobin 16.1 (*)    HCT 48.6 (*)    All other components within normal limits  URINALYSIS COMPLETEWITH MICROSCOPIC ([facility] ONLY) - Abnormal; Notable for the following:    Color, Urine AMBER (*)    APPearance CLEAR (*)    Ketones, ur 1+ (*)    Protein, ur 30 (*)    Bacteria, UA RARE (*)    Squamous Epithelial / LPF 0-5 (*)    All other components within normal limits  LIPASE, BLOOD   ____________________________________________  EKG  None ____________________________________________  RADIOLOGY  None ____________________________________________   PROCEDURES  Procedure(s) performed: None  Critical Care performed: No  ____________________________________________   INITIAL IMPRESSION / ASSESSMENT AND PLAN / ED COURSE  Pertinent labs & imaging results that were available during my care of the patient were reviewed by me and considered in my medical decision making (see chart for details).  55 year old female with hepatitis C, COPD, bipolar disorder who presents feeling dehydrated. Unable to void for urine at this time. Will initiate IV fluid resuscitation, IV antiemetic and reassess. Abdomen is soft and benign; nontender to palpation.  ----------------------------------------- 1:59 AM on 06/10/2015 -----------------------------------------  Updated patient of laboratory and urinalysis results. She is overall feeling better and drinking water. Strict return precautions  given. Patient verbalizes understanding and agrees with plan of care. ____________________________________________   FINAL CLINICAL IMPRESSION(S) / ED DIAGNOSES  Final diagnoses:  Dehydration      Irean Hong, MD 06/10/15 [phone]

## 2015-06-10 NOTE — Discharge Instructions (Signed)
1. Drink plenty of fluids daily. 2. Return to the ER for worsening symptoms, persistent vomiting, difficulty breathing or other concerns.  Dehydration, Adult Dehydration is a condition in which you do not have enough fluid or water in your body. It happens when you take in less fluid than you lose. Vital organs such as the kidneys, brain, and heart cannot function without a proper amount of fluids. Any loss of fluids from the body can cause dehydration.  Dehydration can range from mild to severe. This condition should be treated right away to help prevent it from becoming severe. CAUSES  This condition may be caused by:  Vomiting.  Diarrhea.  Excessive sweating, such as when exercising in hot or humid weather.  Not drinking enough fluid during strenuous exercise or during an illness.  Excessive urine output.  Fever.  Certain medicines. RISK FACTORS This condition is more likely to develop in:  People who are taking certain medicines that cause the body to lose excess fluid (diuretics).   People who have a chronic illness, such as diabetes, that may increase urination.  Older adults.   People who live at high altitudes.   People who participate in endurance sports.  SYMPTOMS  Mild Dehydration  Thirst.  Dry lips.  Slightly dry mouth.  Dry, warm skin. Moderate Dehydration  Very dry mouth.   Muscle cramps.   Dark urine and decreased urine production.   Decreased tear production.   Headache.   Light-headedness, especially when you stand up from a sitting position.  Severe Dehydration  Changes in skin.   Cold and clammy skin.   Skin does not spring back quickly when lightly pinched and released.   Changes in body fluids.   Extreme thirst.   No tears.   Not able to sweat when body temperature is high, such as in hot weather.   Minimal urine production.   Changes in vital signs.   Rapid, weak pulse (more than 100 beats per minute  when you are sitting still).   Rapid breathing.   Low blood pressure.   Other changes.   Sunken eyes.   Cold hands and feet.   Confusion.  Lethargy and difficulty being awakened.  Fainting (syncope).   Short-term weight loss.   Unconsciousness. DIAGNOSIS  This condition may be diagnosed based on your symptoms. You may also have tests to determine how severe your dehydration is. These tests may include:   Urine tests.   Blood tests.  TREATMENT  Treatment for this condition depends on the severity. Mild or moderate dehydration can often be treated at home. Treatment should be started right away. Do not wait until dehydration becomes severe. Severe dehydration needs to be treated at the hospital. Treatment for Mild Dehydration  Drinking plenty of water to replace the fluid you have lost.   Replacing minerals in your blood (electrolytes) that you may have lost.  Treatment for Moderate Dehydration  Consuming oral rehydration solution (ORS). Treatment for Severe Dehydration  Receiving fluid through an IV tube.   Receiving electrolyte solution through a feeding tube that is passed through your nose and into your stomach (nasogastric tube or NG tube).  Correcting any abnormalities in electrolytes. HOME CARE INSTRUCTIONS   Drink enough fluid to keep your urine clear or pale yellow.   Drink water or fluid slowly by taking small sips. You can also try sucking on ice cubes.  Have food or beverages that contain electrolytes. Examples include bananas and sports drinks.  Take over-the-counter  and prescription medicines only as told by your health care provider.   Prepare ORS according to the manufacturer's instructions. Take sips of ORS every 5 minutes until your urine returns to normal.  If you have vomiting or diarrhea, continue to try to drink water, ORS, or both.   If you have diarrhea, avoid:   Beverages that contain caffeine.   Fruit juice.    Milk.   Carbonated soft drinks.  Do not take salt tablets. This can lead to the condition of having too much sodium in your body (hypernatremia).  SEEK MEDICAL CARE IF:  You cannot eat or drink without vomiting.  You have had moderate diarrhea during a period of more than 24 hours.  You have a fever. SEEK IMMEDIATE MEDICAL CARE IF:   You have extreme thirst.  You have severe diarrhea.  You have not urinated in 6-8 hours, or you have urinated only a small amount of very dark urine.  You have shriveled skin.  You are dizzy, confused, or both.   This information is not intended to replace advice given to you by your health care provider. Make sure you discuss any questions you have with your health care provider.   Document Released: 02/24/2005 Document Revised: 11/15/2014 Document Reviewed: 07/12/2014 Elsevier Interactive Patient Education [employer]2016 [employer]

## 2015-06-10 NOTE — ED Notes (Signed)
Ice chips given.  Pt tolerating well.  UA sent.

## 2015-06-12 ENCOUNTER — Ambulatory Visit: Payer: Medicare Other | Attending: Pain Medicine | Admitting: Pain Medicine

## 2015-06-12 ENCOUNTER — Encounter: Payer: Self-pay | Admitting: Pain Medicine

## 2015-06-12 VITALS — BP 130/76 | HR 61 | Temp 95.7°F | Resp 15 | Ht 60.0 in | Wt 142.0 lb

## 2015-06-12 DIAGNOSIS — M5416 Radiculopathy, lumbar region: Secondary | ICD-10-CM

## 2015-06-12 DIAGNOSIS — M533 Sacrococcygeal disorders, not elsewhere classified: Secondary | ICD-10-CM

## 2015-06-12 DIAGNOSIS — M5116 Intervertebral disc disorders with radiculopathy, lumbar region: Secondary | ICD-10-CM | POA: Insufficient documentation

## 2015-06-12 DIAGNOSIS — M47816 Spondylosis without myelopathy or radiculopathy, lumbar region: Secondary | ICD-10-CM | POA: Diagnosis not present

## 2015-06-12 DIAGNOSIS — M5481 Occipital neuralgia: Secondary | ICD-10-CM | POA: Insufficient documentation

## 2015-06-12 DIAGNOSIS — M545 Low back pain: Secondary | ICD-10-CM | POA: Diagnosis present

## 2015-06-12 DIAGNOSIS — M5136 Other intervertebral disc degeneration, lumbar region: Secondary | ICD-10-CM

## 2015-06-12 DIAGNOSIS — R51 Headache: Secondary | ICD-10-CM | POA: Insufficient documentation

## 2015-06-12 MED ORDER — OXYCODONE HCL 5 MG PO TABS: ORAL_TABLET | ORAL | Status: DC

## 2015-06-12 NOTE — Progress Notes (Signed)
   Subjective:    Patient ID: Sherri Foster, female    DOB: 1960/10/25, 55 y.o.   MRN: [id number]  HPI  Patient is 55 year old female who returns to pain management for further evaluation and treatment of pain involving the lower back lower extremity regions pressure the right lower extremity region as well as headaches. The patient states that she has had pain involving the lower back lower extremity region especially on the right. Patient states she wishes to undergo interventional treatment for the pain which occurs after prolonged standing walking and becomes more intense as the day progresses. We will consider additional studies as well as surgical evaluation and patient prefers to avoid additional studies and surgical evaluation at this time. We will proceed with scheduling patient for interventional treatment at time return appointment was time we will proceed with lumbosacral selective nerve root block. The patient will continue present medication at this time including oxycodone. We may consider patient for PNCV EMG studies and other studies as discussed and as explained to patient on today's visit. The patient wished to proceed with interventional treatment consisting of lumbosacral selective nerve root block in attempt to decrease severity of symptoms, minimize progression of symptoms, and avoid the need for more involved treatment. The patient agreed to suggested treatment plan.     Review of Systems     Objective:   Physical Exam  There was tenderness to palpation of the splenius capitis and occipitalis musculature regions palpation which reproduces pain of mild degree with mild tenderness over the acromioclavicular and glenohumeral joint regions as well. Appeared to be bilaterally equal grip strength with Tinel and Phalen's maneuver reproducing minimal discomfort. There was minimal tenderness of the cervical facet cervical paraspinal musculature region. Palpation over the thoracic  facet thoracic paraspinal must reason was with mild to moderate tends to palpation of muscle spasms involving the mid and lower thoracic paraspinal musculature region. Palpation over the lumbar paraspinal must reason lumbar facet region was attends to palpation of moderate degree with lateral bending rotation extension and palpation over the lumbar facets reproducing moderate discomfort. There was mild tenderness over the PSIS PII S region as well as the gluteal and piriformis musculature region. There was minimal tense to palpation of the greater trochanteric region and iliotibial band region. Straight leg raise was tolerates approximately 20 with questionable increased pain with dorsiflexion noted. There was negative clonus negative Homans. DTRs appeared to be trace at the knees and difficult to elicit on the right. There was an 80 clonus negative Homans. Abdomen nontender with no costovertebral tenderness noted.      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 May consider pending follow-up evaluations  F/U neurological evaluation. May consider PNCV/EMG studies and other studies pending follow-up evaluations  May consider radiofrequency rhizolysis or intraspinal procedures pending response to present treatment and F/U evaluation   Patient to call [facility] should patient have concerns prior to scheduled return appoinment

## 2015-06-12 NOTE — Patient Instructions (Addendum)
PLAN   Continue present medication oxycodone   Lumbosacral selective nerve root block to be performed at time of return appointment  F/U PCP Dr.George for evaliation of  BP and general medical  condition  F/U surgical evaluation. May consider pending follow-up evaluations  F/U neurological evaluation. May consider PNCV/EMG studies and other studies pending follow-up evaluations  May consider radiofrequency rhizolysis or intraspinal procedures pending response to present treatment and F/U evaluation   Patient to call [facility] should patient have concerns prior to scheduled return appoinmentGENERAL RISKS AND COMPLICATIONS  What are the risk, side effects and possible complications? Generally speaking, most procedures are safe.  However, with any procedure there are risks, side effects, and the possibility of complications.  The risks and complications are dependent upon the sites that are lesioned, or the type of nerve block to be performed.  The closer the procedure is to the spine, the more serious the risks are.  Great care is taken when placing the radio frequency needles, block needles or lesioning probes, but sometimes complications can occur. 1. Infection: Any time there is an injection through the skin, there is a risk of infection.  This is why sterile conditions are used for these blocks.  There are four possible types of infection. 1. Localized skin infection. 2. Central Nervous System Infection-This can be in the form of Meningitis, which can be deadly. 3. Epidural Infections-This can be in the form of an epidural abscess, which can cause pressure inside of the spine, causing compression of the spinal cord with subsequent paralysis. This would require an emergency surgery to decompress, and there are no guarantees that the patient would recover from the paralysis. 4. Discitis-This is an infection of the intervertebral discs.  It occurs in about 1% of discography  procedures.  It is difficult to treat and it may lead to surgery.        2. Pain: the needles have to go through skin and soft tissues, will cause soreness.       3. Damage to internal structures:  The nerves to be lesioned may be near blood vessels or    other nerves which can be potentially damaged.       4. Bleeding: Bleeding is more common if the patient is taking blood thinners such as  aspirin, Coumadin, Ticiid, Plavix, etc., or if he/she have some genetic predisposition  such as hemophilia. Bleeding into the spinal canal can cause compression of the spinal  cord with subsequent paralysis.  This would require an emergency surgery to  decompress and there are no guarantees that the patient would recover from the  paralysis.       5. Pneumothorax:  Puncturing of a lung is a possibility, every time a needle is introduced in  the area of the chest or upper back.  Pneumothorax refers to free air around the  collapsed lung(s), inside of the thoracic cavity (chest cavity).  Another two possible  complications related to a similar event would include: Hemothorax and Chylothorax.   These are variations of the Pneumothorax, where instead of air around the collapsed  lung(s), you may have blood or chyle, respectively.       6. Spinal headaches: They may occur with any procedures in the area of the spine.       7. Persistent CSF (Cerebro-Spinal Fluid) leakage: This is a rare problem, but may occur  with prolonged intrathecal or epidural catheters either due to the formation of a fistulous  track or a dural tear.       8. Nerve damage: By working so close to the spinal cord, there is always a possibility of  nerve damage, which could be as serious as a permanent spinal cord injury with  paralysis.       9. Death:  Although rare, severe deadly allergic reactions known as "Anaphylactic  reaction" can occur to any of the medications used.      10. Worsening of the symptoms:  We can always make thing worse.  What  are the chances of something like this happening? Chances of any of this occuring are extremely low.  By statistics, you have more of a chance of getting killed in a motor vehicle accident: while driving to the hospital than any of the above occurring .  Nevertheless, you should be aware that they are possibilities.  In general, it is similar to taking a shower.  Everybody knows that you can slip, hit your head and get killed.  Does that mean that you should not shower again?  Nevertheless always keep in mind that statistics do not mean anything if you happen to be on the wrong side of them.  Even if a procedure has a 1 (one) in a 1,000,000 (million) chance of going wrong, it you happen to be that one..Also, keep in mind that by statistics, you have more of a chance of having something go wrong when taking medications.  Who should not have this procedure? If you are on a blood thinning medication (e.g. Coumadin, Plavix, see list of "Blood Thinners"), or if you have an active infection going on, you should not have the procedure.  If you are taking any blood thinners, please inform your physician.  How should I prepare for this procedure?  Do not eat or drink anything at least six hours prior to the procedure.  Bring a driver with you .  It cannot be a taxi.  Come accompanied by an adult that can drive you back, and that is strong enough to help you if your legs get weak or numb from the local anesthetic.  Take all of your medicines the morning of the procedure with just enough water to swallow them.  If you have diabetes, make sure that you are scheduled to have your procedure done first thing in the morning, whenever possible.  If you have diabetes, take only half of your insulin dose and notify our nurse that you have done so as soon as you arrive at the clinic.  If you are diabetic, but only take blood sugar pills (oral hypoglycemic), then do not take them on the morning of your procedure.   You may take them after you have had the procedure.  Do not take aspirin or any aspirin-containing medications, at least eleven (11) days prior to the procedure.  They may prolong bleeding.  Wear loose fitting clothing that may be easy to take off and that you would not mind if it got stained with Betadine or blood.  Do not wear any jewelry or perfume  Remove any nail coloring.  It will interfere with some of our monitoring equipment.  NOTE: Remember that this is not meant to be interpreted as a complete list of all possible complications.  Unforeseen problems may occur.  BLOOD THINNERS The following drugs contain aspirin or other products, which can cause increased bleeding during surgery and should not be taken for 2 weeks prior to and 1 week after surgery.  If you should need take something for relief of minor pain, you may take acetaminophen which is found in Tylenol,m Datril, Anacin-3 and Panadol. It is not blood thinner. The products listed below are.  Do not take any of the products listed below in addition to any listed on your instruction sheet.  A.P.C or A.P.C with Codeine Codeine Phosphate Capsules #3 Ibuprofen Ridaura  ABC compound Congesprin Imuran rimadil  Advil Cope Indocin Robaxisal  Alka-Seltzer Effervescent Pain Reliever and Antacid Coricidin or Coricidin-D  Indomethacin Rufen  Alka-Seltzer plus Cold Medicine Cosprin Ketoprofen S-A-C Tablets  Anacin Analgesic Tablets or Capsules Coumadin Korlgesic Salflex  Anacin Extra Strength Analgesic tablets or capsules CP-2 Tablets Lanoril Salicylate  Anaprox Cuprimine Capsules Levenox Salocol  Anexsia-D Dalteparin Magan Salsalate  Anodynos Darvon compound Magnesium Salicylate Sine-off  Ansaid Dasin Capsules Magsal Sodium Salicylate  Anturane Depen Capsules Marnal Soma  APF Arthritis pain formula Dewitt's Pills Measurin Stanback  Argesic Dia-Gesic Meclofenamic Sulfinpyrazone  Arthritis Bayer Timed Release Aspirin Diclofenac  Meclomen Sulindac  Arthritis pain formula Anacin Dicumarol Medipren Supac  Analgesic (Safety coated) Arthralgen Diffunasal Mefanamic Suprofen  Arthritis Strength Bufferin Dihydrocodeine Mepro Compound Suprol  Arthropan liquid Dopirydamole Methcarbomol with Aspirin Synalgos  ASA tablets/Enseals Disalcid Micrainin Tagament  Ascriptin Doan's Midol Talwin  Ascriptin A/D Dolene Mobidin Tanderil  Ascriptin Extra Strength Dolobid Moblgesic Ticlid  Ascriptin with Codeine Doloprin or Doloprin with Codeine Momentum Tolectin  Asperbuf Duoprin Mono-gesic Trendar  Aspergum Duradyne Motrin or Motrin IB Triminicin  Aspirin plain, buffered or enteric coated Durasal Myochrisine Trigesic  Aspirin Suppositories Easprin Nalfon Trillsate  Aspirin with Codeine Ecotrin Regular or Extra Strength Naprosyn Uracel  Atromid-S Efficin Naproxen Ursinus  Auranofin Capsules Elmiron Neocylate Vanquish  Axotal Emagrin Norgesic Verin  Azathioprine Empirin or Empirin with Codeine Normiflo Vitamin E  Azolid Emprazil Nuprin Voltaren  Bayer Aspirin plain, buffered or children's or timed BC Tablets or powders Encaprin Orgaran Warfarin Sodium  Buff-a-Comp Enoxaparin Orudis Zorpin  Buff-a-Comp with Codeine Equegesic Os-Cal-Gesic   Buffaprin Excedrin plain, buffered or Extra Strength Oxalid   Bufferin Arthritis Strength Feldene Oxphenbutazone   Bufferin plain or Extra Strength Feldene Capsules Oxycodone with Aspirin   Bufferin with Codeine Fenoprofen Fenoprofen Pabalate or Pabalate-SF   Buffets II Flogesic Panagesic   Buffinol plain or Extra Strength Florinal or Florinal with Codeine Panwarfarin   Buf-Tabs Flurbiprofen Penicillamine   Butalbital Compound Four-way cold tablets Penicillin   Butazolidin Fragmin Pepto-Bismol   Carbenicillin Geminisyn Percodan   Carna Arthritis Reliever Geopen Persantine   Carprofen Gold's salt Persistin   Chloramphenicol Goody's Phenylbutazone   Chloromycetin Haltrain Piroxlcam   Clmetidine  heparin Plaquenil   Cllnoril Hyco-pap Ponstel   Clofibrate Hydroxy chloroquine Propoxyphen         Before stopping any of these medications, be sure to consult the physician who ordered them.  Some, such as Coumadin (Warfarin) are ordered to prevent or treat serious conditions such as "deep thrombosis", "pumonary embolisms", and other heart problems.  The amount of time that you may need off of the medication may also vary with the medication and the reason for which you were taking it.  If you are taking any of these medications, please make sure you notify your pain physician before you undergo any procedures.

## 2015-06-12 NOTE — Progress Notes (Signed)
Safety precautions to be maintained throughout the outpatient stay will include: orient to surroundings, keep bed in low position, maintain call bell within reach at all times, provide assistance with transfer out of bed and ambulation.  

## 2015-06-21 ENCOUNTER — Other Ambulatory Visit: Payer: Self-pay | Admitting: Pain Medicine

## 2015-07-09 ENCOUNTER — Other Ambulatory Visit: Payer: Self-pay | Admitting: Family Medicine

## 2015-07-09 DIAGNOSIS — Z1231 Encounter for screening mammogram for malignant neoplasm of breast: Secondary | ICD-10-CM

## 2015-07-11 ENCOUNTER — Encounter: Payer: Self-pay | Admitting: Pain Medicine

## 2015-07-11 ENCOUNTER — Ambulatory Visit: Payer: Medicare Other | Attending: Pain Medicine | Admitting: Pain Medicine

## 2015-07-11 VITALS — BP 175/99 | HR 67 | Temp 97.8°F | Resp 16 | Ht 60.0 in | Wt 140.0 lb

## 2015-07-11 DIAGNOSIS — M5136 Other intervertebral disc degeneration, lumbar region: Secondary | ICD-10-CM | POA: Insufficient documentation

## 2015-07-11 DIAGNOSIS — M533 Sacrococcygeal disorders, not elsewhere classified: Secondary | ICD-10-CM

## 2015-07-11 DIAGNOSIS — M5481 Occipital neuralgia: Secondary | ICD-10-CM

## 2015-07-11 DIAGNOSIS — M545 Low back pain: Secondary | ICD-10-CM | POA: Insufficient documentation

## 2015-07-11 DIAGNOSIS — M47816 Spondylosis without myelopathy or radiculopathy, lumbar region: Secondary | ICD-10-CM

## 2015-07-11 DIAGNOSIS — M5416 Radiculopathy, lumbar region: Secondary | ICD-10-CM

## 2015-07-11 DIAGNOSIS — M79606 Pain in leg, unspecified: Secondary | ICD-10-CM | POA: Diagnosis present

## 2015-07-11 MED ORDER — FENTANYL CITRATE (PF) 100 MCG/2ML IJ SOLN: 100.0000 ug | Freq: Once | INTRAMUSCULAR | Status: DC

## 2015-07-11 MED ORDER — ORPHENADRINE CITRATE 30 MG/ML IJ SOLN: 60.0000 mg | Freq: Once | INTRAMUSCULAR | Status: DC

## 2015-07-11 MED ORDER — LACTATED RINGERS IV SOLN: 1000.0000 mL | INTRAVENOUS | Status: DC

## 2015-07-11 MED ORDER — TRIAMCINOLONE ACETONIDE 40 MG/ML IJ SUSP
INTRAMUSCULAR | Status: AC
  Administered 2015-07-11: 12:00:00
  Filled 2015-07-11: qty 1

## 2015-07-11 MED ORDER — FENTANYL CITRATE (PF) 100 MCG/2ML IJ SOLN
INTRAMUSCULAR | Status: AC
  Administered 2015-07-11: 100 ug via INTRAVENOUS
  Filled 2015-07-11: qty 2

## 2015-07-11 MED ORDER — MIDAZOLAM HCL 5 MG/5ML IJ SOLN: 5.0000 mg | Freq: Once | INTRAMUSCULAR | Status: DC

## 2015-07-11 MED ORDER — MIDAZOLAM HCL 5 MG/5ML IJ SOLN
INTRAMUSCULAR | Status: AC
  Administered 2015-07-11: 4 mg via INTRAVENOUS
  Filled 2015-07-11: qty 5

## 2015-07-11 MED ORDER — BUPIVACAINE HCL (PF) 0.25 % IJ SOLN: 30.0000 mL | Freq: Once | INTRAMUSCULAR | Status: DC

## 2015-07-11 MED ORDER — OXYCODONE HCL 5 MG PO TABS: ORAL_TABLET | ORAL | Status: DC

## 2015-07-11 MED ORDER — BUPIVACAINE HCL (PF) 0.25 % IJ SOLN
INTRAMUSCULAR | Status: AC
  Administered 2015-07-11: 12:00:00
  Filled 2015-07-11: qty 30

## 2015-07-11 MED ORDER — LIDOCAINE HCL (PF) 1 % IJ SOLN: 10.0000 mL | Freq: Once | INTRAMUSCULAR | Status: DC

## 2015-07-11 MED ORDER — ORPHENADRINE CITRATE 30 MG/ML IJ SOLN
INTRAMUSCULAR | Status: AC
  Filled 2015-07-11: qty 2

## 2015-07-11 MED ORDER — TRIAMCINOLONE ACETONIDE 40 MG/ML IJ SUSP: 40.0000 mg | Freq: Once | INTRAMUSCULAR | Status: DC

## 2015-07-11 NOTE — Patient Instructions (Addendum)
PLAN   Continue present medication oxycodone  F/U PCP Dr.George for evaliation of  BP and general medical  condition  F/U surgical evaluation. May consider pending follow-up evaluations  F/U neurological evaluation. May consider PNCV/EMG studies and other studies pending follow-up evaluations  May consider radiofrequency rhizolysis or intraspinal procedures pending response to present treatment and F/U evaluation   Patient to call [facility] should patient have concerns prior to scheduled return appoinmentPain Management Discharge Instructions  General Discharge Instructions :  If you need to reach your doctor call: Monday-Friday 8:00 am - 4:00 pm at [phone] or toll free [phone].  After clinic hours [phone] to have operator reach doctor.  Bring all of your medication bottles to all your appointments in the pain clinic.  To cancel or reschedule your appointment with Pain Management please remember to call 24 hours in advance to avoid a fee.  Refer to the educational materials which you have been given on: General Risks, I had my Procedure. Discharge Instructions, Post Sedation.  Post Procedure Instructions:  The drugs you were given will stay in your system until tomorrow, so for the next 24 hours you should not drive, make any legal decisions or drink any alcoholic beverages.  You may eat anything you prefer, but it is better to start with liquids then soups and crackers, and gradually work up to solid foods.  Please notify your doctor immediately if you have any unusual bleeding, trouble breathing or pain that is not related to your normal pain.  Depending on the type of procedure that was done, some parts of your body may feel week and/or numb.  This usually clears up by tonight or the next day.  Walk with the use of an assistive device or accompanied by an adult for the 24 hours.  You may use ice on the affected area for the first 24 hours.  Put  ice in a Ziploc bag and cover with a towel and place against area 15 minutes on 15 minutes off.  You may switch to heat after 24 hours.

## 2015-07-11 NOTE — Progress Notes (Signed)
Safety precautions to be maintained throughout the outpatient stay will include: orient to surroundings, keep bed in low position, maintain call bell within reach at all times, provide assistance with transfer out of bed and ambulation.  

## 2015-07-11 NOTE — Progress Notes (Signed)
   Subjective:    Patient ID: Sherri Foster, female    DOB: [birth year], 55 y.o.   MRN: [id number]  HPI  PROCEDURE PERFORMED: Lumbosacral selective nerve root block   NOTE: The patient is a 55 y.o. female who returns to [facility] for further evaluation and treatment of pain involving the lumbar and lower extremity region. 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 There is concern regarding intraspinal abnormalities contributing to the patient's symptomatology with concern regarding what appears to be lumbar radiculopathy. The risks, benefits, and expectations of the procedure have been explained to the patient who was understanding and in agreement with suggested treatment plan. We will proceed with interventional treatment as discussed and as explained to the patient. The patient is understanding and in agreement with suggested treatment plan.   DESCRIPTION OF PROCEDURE: Lumbosacral selective nerve root block with IV Versed, IV fentanyl conscious sedation, EKG, blood pressure, pulse, capnography, and pulse oximetry monitoring. The procedure was performed with the patient in the prone position under fluoroscopic guidance. With the patient in the prone position, Betadine prep of proposed entry site was performed. Local anesthetic skin wheal of proposed needle entry site was prepared with 1.5% plain lidocaine with AP view of the lumbosacral spine.   PROCEDURE #1: Needle placement at the right L 2 vertebral body: A 22 -gauge needle was inserted at the inferior border of the transverse process of the vertebral body with needle placed medial to the midline of the transverse process on AP view of the lumbosacral spine.   NEEDLE PLACEMENT AT  L3, L4, and L5  VERTEBRAL BODY LEVELS  Needle  placement  was accomplished at L3, L4, and L5  vertebral body levels on the right side exactly as was accomplished at the L2  vertebral body level  and utilizing the same technique and under fluoroscopic guidance.   Needle placement was then verified on lateral view at all levels with needle tip documented to be in the posterior superior quadrant of the intervertebral foramen of  L 2, L3, L4, and L5.. Following negative aspiration for heme and CSF at each level, each level was injected with 3 mL of 0.25% bupivacaine with Kenalog.   The patient tolerated the procedure well. A total of 10 mg of Kenalog was utilized for the procedure.   PLAN:  1. Medications: Will continue presently prescribed medication oxycodone. 2. The patient is to undergo follow-up evaluation with PCP Dr. Greggory StallionGeorge for evaluation of blood pressure and general medical condition status post procedure performed on today's visit. 3. Surgical follow-up evaluation. Has been addressed 4. Neurological evaluation. May consider PNCV EMG studies and other studies 5. May consider radiofrequency procedures, implantation type procedures and other treatment pending response to treatment and follow-up evaluation. 6. The patient has been advise do adhere to proper body mechanics and avoid activities which may aggravate condition. 7. The patient has been advised to call the Pain Management Center prior to scheduled return appointment should there be significant change in the patient's condition or should the patient have other concerns regarding condition prior to scheduled return appointment.   Review of Systems     Objective:   Physical Exam        Assessment & Plan:

## 2015-07-12 ENCOUNTER — Telehealth: Payer: Self-pay

## 2015-07-12 NOTE — Telephone Encounter (Signed)
Post procedure phone call.  Patient does not have voice mail set up so was unable to leave message.

## 2015-07-31 ENCOUNTER — Other Ambulatory Visit: Payer: Self-pay | Admitting: Pain Medicine

## 2015-07-31 ENCOUNTER — Telehealth: Payer: Self-pay | Admitting: Pain Medicine

## 2015-07-31 ENCOUNTER — Ambulatory Visit: Payer: Medicare Other | Admitting: Pain Medicine

## 2015-07-31 DIAGNOSIS — M47816 Spondylosis without myelopathy or radiculopathy, lumbar region: Secondary | ICD-10-CM

## 2015-07-31 DIAGNOSIS — M533 Sacrococcygeal disorders, not elsewhere classified: Secondary | ICD-10-CM

## 2015-07-31 DIAGNOSIS — M5481 Occipital neuralgia: Secondary | ICD-10-CM

## 2015-07-31 DIAGNOSIS — M5136 Other intervertebral disc degeneration, lumbar region: Secondary | ICD-10-CM

## 2015-07-31 DIAGNOSIS — M5416 Radiculopathy, lumbar region: Secondary | ICD-10-CM

## 2015-07-31 NOTE — Telephone Encounter (Signed)
Wants to come on Wed for procedure for migraine she can't get rid of

## 2015-07-31 NOTE — Telephone Encounter (Signed)
Sherri Foster, [name] and [name]  Please schedule patient for Greater Occipital Nerve Blocks for Wednesday, 08/01/2015  IF INSURANCE WILL ALLOW

## 2015-07-31 NOTE — Telephone Encounter (Signed)
Dr. Metta Clinesrisp              Please advise on this. Patient missed med refill appointment today.

## 2015-08-01 ENCOUNTER — Encounter: Payer: Self-pay | Admitting: Pain Medicine

## 2015-08-01 ENCOUNTER — Ambulatory Visit: Payer: Medicare Other | Attending: Pain Medicine | Admitting: Pain Medicine

## 2015-08-01 VITALS — BP 171/96 | HR 67 | Temp 98.3°F | Resp 16 | Ht 60.0 in | Wt 140.0 lb

## 2015-08-01 DIAGNOSIS — M5416 Radiculopathy, lumbar region: Secondary | ICD-10-CM

## 2015-08-01 DIAGNOSIS — G43909 Migraine, unspecified, not intractable, without status migrainosus: Secondary | ICD-10-CM | POA: Diagnosis not present

## 2015-08-01 DIAGNOSIS — M542 Cervicalgia: Secondary | ICD-10-CM | POA: Diagnosis present

## 2015-08-01 DIAGNOSIS — M533 Sacrococcygeal disorders, not elsewhere classified: Secondary | ICD-10-CM

## 2015-08-01 DIAGNOSIS — R51 Headache: Secondary | ICD-10-CM | POA: Diagnosis present

## 2015-08-01 DIAGNOSIS — M47816 Spondylosis without myelopathy or radiculopathy, lumbar region: Secondary | ICD-10-CM

## 2015-08-01 DIAGNOSIS — M5481 Occipital neuralgia: Secondary | ICD-10-CM

## 2015-08-01 DIAGNOSIS — M5136 Other intervertebral disc degeneration, lumbar region: Secondary | ICD-10-CM

## 2015-08-01 MED ORDER — LACTATED RINGERS IV SOLN: 1000.0000 mL | INTRAVENOUS | Status: DC

## 2015-08-01 MED ORDER — OXYCODONE HCL 5 MG PO TABS: ORAL_TABLET | ORAL | Status: DC

## 2015-08-01 MED ORDER — FENTANYL CITRATE (PF) 100 MCG/2ML IJ SOLN
100.0000 ug | Freq: Once | INTRAMUSCULAR | Status: AC
  Administered 2015-08-01: 50 ug via INTRAVENOUS
  Filled 2015-08-01: qty 2

## 2015-08-01 MED ORDER — BUPIVACAINE HCL (PF) 0.25 % IJ SOLN
30.0000 mL | Freq: Once | INTRAMUSCULAR | Status: AC
  Administered 2015-08-01: 30 mL
  Filled 2015-08-01: qty 30

## 2015-08-01 MED ORDER — TRIAMCINOLONE ACETONIDE 40 MG/ML IJ SUSP
40.0000 mg | Freq: Once | INTRAMUSCULAR | Status: AC
  Administered 2015-08-01: 40 mg
  Filled 2015-08-01: qty 1

## 2015-08-01 MED ORDER — ORPHENADRINE CITRATE 30 MG/ML IJ SOLN
60.0000 mg | Freq: Once | INTRAMUSCULAR | Status: DC
  Filled 2015-08-01: qty 2

## 2015-08-01 MED ORDER — MIDAZOLAM HCL 5 MG/5ML IJ SOLN
5.0000 mg | Freq: Once | INTRAMUSCULAR | Status: AC
  Administered 2015-08-01: 2 mg via INTRAVENOUS
  Filled 2015-08-01: qty 5

## 2015-08-01 NOTE — Patient Instructions (Addendum)
PLAN   Continue present medication oxycodone   F/U PCP Dr.George for evaliation of  BP and general medical  condition  F/U surgical evaluation. May consider pending follow-up evaluations  F/U neurological evaluation. May consider MRI and CT scan of head as well as other studies pending follow-up evaluations  May consider radiofrequency rhizolysis or intraspinal procedures pending response to present treatment and F/U evaluation   Patient to call Pain Management Center should patient have concerns prior to scheduled return appoinmentPain Management Discharge Instructions  General Discharge Instructions :  If you need to reach your doctor call: Monday-Friday 8:00 am - 4:00 pm at [phone] or toll free [phone].  After clinic hours [phone] to have operator reach doctor.  Bring all of your medication bottles to all your appointments in the pain clinic.  To cancel or reschedule your appointment with Pain Management please remember to call 24 hours in advance to avoid a fee.  Refer to the educational materials which you have been given on: General Risks, I had my Procedure. Discharge Instructions, Post Sedation.  Post Procedure Instructions:  The drugs you were given will stay in your system until tomorrow, so for the next 24 hours you should not drive, make any legal decisions or drink any alcoholic beverages.  You may eat anything you prefer, but it is better to start with liquids then soups and crackers, and gradually work up to solid foods.  Please notify your doctor immediately if you have any unusual bleeding, trouble breathing or pain that is not related to your normal pain.  Depending on the type of procedure that was done, some parts of your body may feel week and/or numb.  This usually clears up by tonight or the next day.  Walk with the use of an assistive device or accompanied by an adult for the 24 hours.  You may use ice on the affected area for the first 24  hours.  Put ice in a Ziploc bag and cover with a towel and place against area 15 minutes on 15 minutes off.  You may switch to heat after 24 hours.GENERAL RISKS AND COMPLICATIONS  What are the risk, side effects and possible complications? Generally speaking, most procedures are safe.  However, with any procedure there are risks, side effects, and the possibility of complications.  The risks and complications are dependent upon the sites that are lesioned, or the type of nerve block to be performed.  The closer the procedure is to the spine, the more serious the risks are.  Great care is taken when placing the radio frequency needles, block needles or lesioning probes, but sometimes complications can occur. 1. Infection: Any time there is an injection through the skin, there is a risk of infection.  This is why sterile conditions are used for these blocks.  There are four possible types of infection. 1. Localized skin infection. 2. Central Nervous System Infection-This can be in the form of Meningitis, which can be deadly. 3. Epidural Infections-This can be in the form of an epidural abscess, which can cause pressure inside of the spine, causing compression of the spinal cord with subsequent paralysis. This would require an emergency surgery to decompress, and there are no guarantees that the patient would recover from the paralysis. 4. Discitis-This is an infection of the intervertebral discs.  It occurs in about 1% of discography procedures.  It is difficult to treat and it may lead to surgery.        2. Pain:  the needles have to go through skin and soft tissues, will cause soreness.       3. Damage to internal structures:  The nerves to be lesioned may be near blood vessels or    other nerves which can be potentially damaged.       4. Bleeding: Bleeding is more common if the patient is taking blood thinners such as  aspirin, Coumadin, Ticiid, Plavix, etc., or if he/she have some genetic  predisposition  such as hemophilia. Bleeding into the spinal canal can cause compression of the spinal  cord with subsequent paralysis.  This would require an emergency surgery to  decompress and there are no guarantees that the patient would recover from the  paralysis.       5. Pneumothorax:  Puncturing of a lung is a possibility, every time a needle is introduced in  the area of the chest or upper back.  Pneumothorax refers to free air around the  collapsed lung(s), inside of the thoracic cavity (chest cavity).  Another two possible  complications related to a similar event would include: Hemothorax and Chylothorax.   These are variations of the Pneumothorax, where instead of air around the collapsed  lung(s), you may have blood or chyle, respectively.       6. Spinal headaches: They may occur with any procedures in the area of the spine.       7. Persistent CSF (Cerebro-Spinal Fluid) leakage: This is a rare problem, but may occur  with prolonged intrathecal or epidural catheters either due to the formation of a fistulous  track or a dural tear.       8. Nerve damage: By working so close to the spinal cord, there is always a possibility of  nerve damage, which could be as serious as a permanent spinal cord injury with  paralysis.       9. Death:  Although rare, severe deadly allergic reactions known as "Anaphylactic  reaction" can occur to any of the medications used.      10. Worsening of the symptoms:  We can always make thing worse.  What are the chances of something like this happening? Chances of any of this occuring are extremely low.  By statistics, you have more of a chance of getting killed in a motor vehicle accident: while driving to the hospital than any of the above occurring .  Nevertheless, you should be aware that they are possibilities.  In general, it is similar to taking a shower.  Everybody knows that you can slip, hit your head and get killed.  Does that mean that you should not  shower again?  Nevertheless always keep in mind that statistics do not mean anything if you happen to be on the wrong side of them.  Even if a procedure has a 1 (one) in a 1,000,000 (million) chance of going wrong, it you happen to be that one..Also, keep in mind that by statistics, you have more of a chance of having something go wrong when taking medications.  Who should not have this procedure? If you are on a blood thinning medication (e.g. Coumadin, Plavix, see list of "Blood Thinners"), or if you have an active infection going on, you should not have the procedure.  If you are taking any blood thinners, please inform your physician.  How should I prepare for this procedure?  Do not eat or drink anything at least six hours prior to the procedure.  Bring a driver with you .  It cannot be a taxi.  Come accompanied by an adult that can drive you back, and that is strong enough to help you if your legs get weak or numb from the local anesthetic.  Take all of your medicines the morning of the procedure with just enough water to swallow them.  If you have diabetes, make sure that you are scheduled to have your procedure done first thing in the morning, whenever possible.  If you have diabetes, take only half of your insulin dose and notify our nurse that you have done so as soon as you arrive at the clinic.  If you are diabetic, but only take blood sugar pills (oral hypoglycemic), then do not take them on the morning of your procedure.  You may take them after you have had the procedure.  Do not take aspirin or any aspirin-containing medications, at least eleven (11) days prior to the procedure.  They may prolong bleeding.  Wear loose fitting clothing that may be easy to take off and that you would not mind if it got stained with Betadine or blood.  Do not wear any jewelry or perfume  Remove any nail coloring.  It will interfere with some of our monitoring equipment.  NOTE: Remember that  this is not meant to be interpreted as a complete list of all possible complications.  Unforeseen problems may occur.  BLOOD THINNERS The following drugs contain aspirin or other products, which can cause increased bleeding during surgery and should not be taken for 2 weeks prior to and 1 week after surgery.  If you should need take something for relief of minor pain, you may take acetaminophen which is found in Tylenol,m Datril, Anacin-3 and Panadol. It is not blood thinner. The products listed below are.  Do not take any of the products listed below in addition to any listed on your instruction sheet.  A.P.C or A.P.C with Codeine Codeine Phosphate Capsules #3 Ibuprofen Ridaura  ABC compound Congesprin Imuran rimadil  Advil Cope Indocin Robaxisal  Alka-Seltzer Effervescent Pain Reliever and Antacid Coricidin or Coricidin-D  Indomethacin Rufen  Alka-Seltzer plus Cold Medicine Cosprin Ketoprofen S-A-C Tablets  Anacin Analgesic Tablets or Capsules Coumadin Korlgesic Salflex  Anacin Extra Strength Analgesic tablets or capsules CP-2 Tablets Lanoril Salicylate  Anaprox Cuprimine Capsules Levenox Salocol  Anexsia-D Dalteparin Magan Salsalate  Anodynos Darvon compound Magnesium Salicylate Sine-off  Ansaid Dasin Capsules Magsal Sodium Salicylate  Anturane Depen Capsules Marnal Soma  APF Arthritis pain formula Dewitt's Pills Measurin Stanback  Argesic Dia-Gesic Meclofenamic Sulfinpyrazone  Arthritis Bayer Timed Release Aspirin Diclofenac Meclomen Sulindac  Arthritis pain formula Anacin Dicumarol Medipren Supac  Analgesic (Safety coated) Arthralgen Diffunasal Mefanamic Suprofen  Arthritis Strength Bufferin Dihydrocodeine Mepro Compound Suprol  Arthropan liquid Dopirydamole Methcarbomol with Aspirin Synalgos  ASA tablets/Enseals Disalcid Micrainin Tagament  Ascriptin Doan's Midol Talwin  Ascriptin A/D Dolene Mobidin Tanderil  Ascriptin Extra Strength Dolobid Moblgesic Ticlid  Ascriptin with Codeine  Doloprin or Doloprin with Codeine Momentum Tolectin  Asperbuf Duoprin Mono-gesic Trendar  Aspergum Duradyne Motrin or Motrin IB Triminicin  Aspirin plain, buffered or enteric coated Durasal Myochrisine Trigesic  Aspirin Suppositories Easprin Nalfon Trillsate  Aspirin with Codeine Ecotrin Regular or Extra Strength Naprosyn Uracel  Atromid-S Efficin Naproxen Ursinus  Auranofin Capsules Elmiron Neocylate Vanquish  Axotal Emagrin Norgesic Verin  Azathioprine Empirin or Empirin with Codeine Normiflo Vitamin E  Azolid Emprazil Nuprin Voltaren  Bayer Aspirin plain, buffered or children's or timed BC Tablets or powders Encaprin Orgaran Warfarin Sodium  Buff-a-Comp Enoxaparin Orudis Zorpin  Buff-a-Comp with Codeine Equegesic Os-Cal-Gesic   Buffaprin Excedrin plain, buffered or Extra Strength Oxalid   Bufferin Arthritis Strength Feldene Oxphenbutazone   Bufferin plain or Extra Strength Feldene Capsules Oxycodone with Aspirin   Bufferin with Codeine Fenoprofen Fenoprofen Pabalate or Pabalate-SF   Buffets II Flogesic Panagesic   Buffinol plain or Extra Strength Florinal or Florinal with Codeine Panwarfarin   Buf-Tabs Flurbiprofen Penicillamine   Butalbital Compound Four-way cold tablets Penicillin   Butazolidin Fragmin Pepto-Bismol   Carbenicillin Geminisyn Percodan   Carna Arthritis Reliever Geopen Persantine   Carprofen Gold's salt Persistin   Chloramphenicol Goody's Phenylbutazone   Chloromycetin Haltrain Piroxlcam   Clmetidine heparin Plaquenil   Cllnoril Hyco-pap Ponstel   Clofibrate Hydroxy chloroquine Propoxyphen         Before stopping any of these medications, be sure to consult the physician who ordered them.  Some, such as Coumadin (Warfarin) are ordered to prevent or treat serious conditions such as "deep thrombosis", "pumonary embolisms", and other heart problems.  The amount of time that you may need off of the medication may also vary with the medication and the reason for which  you were taking it.  If you are taking any of these medications, please make sure you notify your pain physician before you undergo any procedures.   Prescription given for oxycodone

## 2015-08-01 NOTE — Progress Notes (Signed)
Safety precautions to be maintained throughout the outpatient stay will include: orient to surroundings, keep bed in low position, maintain call bell within reach at all times, provide assistance with transfer out of bed and ambulation.  

## 2015-08-01 NOTE — Progress Notes (Signed)
   Subjective:    Patient ID: Sherri Foster, female    DOB: 07/05/1960, 55 y.o.   MRN: [id number]  HPI                                     BILATERAL OCCIPITAL NERVE BLOCKS  NOTE: The patient is a 55 y.o.-year-old female who returns to the [facility] Management Center for further evaluation and treatment of pain consisting of pain involving the region of the neck and headache. The patient is with significant pain which travels from the back of the neck to the back of the head and continues to the retro-orbital region. The patient also has a history of migraine headaches. There is concern regarding significant component of patient's pain being due to bilateral occipital neuralgia. .  The risks, benefits, and expectations of the procedure have been discussed and explained to patient, who is understanding and wishes to proceed with interventional treatment as discussed and as explained to patient.  Will proceed with greater occipital nerve blocks with myoneural block injections at this time as discussed and as explained to patient.  All are understanding and in agreement with suggested treatment plan.    PROCEDURE:  Greater occipital nerve block on the left side with IV Versed, IV Fentanyl, conscious sedation, EKG, blood pressure, pulse, capnography, and pulse oximetry monitoring.  Procedure performed with patient in prone position.  Greater occipital nerve block on the left side.   With patient in prone position, Betadine prep of proposed entry site accomplished.  Following identification of the nuchal ridge, 22 -gauge needle was inserted at the level of the nuchal ridge medial to the occipital artery.  Following negative aspiration, 4cc 0.25% bupivacaine with Kenalog injected for left greater occipital nerve block.  Needle was removed.  Patient tolerated injection well.   Greater occipital nerve block on the rightt side. The greater occipital nerve block on the right side was performed exactly as the  left greater occipital nerve block was performed and utilizing the same technique.   A total of 10 mg Kenalog was utilized for the entire procedure.  PLAN:    1. Medications: Will continue presently prescribed medication oxycodone at this time 2. Patient to follow up with primary care physician  Dr. Greggory StallionGeorge for evaluation of blood pressure and general medical condition status post procedure performed on today's visit. 3. Neurological evaluation for further assessment of headaches and for other studies as discussed. 4. Surgical evaluation as discussed.  5. Patient may be candidate for Botox injections, radiofrequency procedures, as well as implantation type procedures pending response to treatment rendered on today's visit and pending follow-up evaluation. 6. Patient has been advised to adhere to proper body mechanics and to avoid activities which appear to aggravate condition.cations:  Will continue presently prescribed medications at this time. 7. The patient is understanding and in agreement with the suggested treatment plan.   Review of Systems     Objective:   Physical Exam        Assessment & Plan:

## 2015-08-02 ENCOUNTER — Telehealth: Payer: Self-pay | Admitting: *Deleted

## 2015-08-02 NOTE — Telephone Encounter (Signed)
Left voicemail with patient to call our office if there are questions or concerns re; procedure on yesterday. 

## 2015-08-13 ENCOUNTER — Ambulatory Visit: Admission: RE | Admit: 2015-08-13 | Payer: Medicare Other | Source: Ambulatory Visit

## 2015-08-20 ENCOUNTER — Ambulatory Visit: Admission: RE | Admit: 2015-08-20 | Payer: Medicare Other | Source: Ambulatory Visit

## 2015-08-30 ENCOUNTER — Encounter: Payer: Self-pay | Admitting: Pain Medicine

## 2015-08-30 ENCOUNTER — Ambulatory Visit: Payer: Medicare Other | Attending: Pain Medicine | Admitting: Pain Medicine

## 2015-08-30 VITALS — BP 150/102 | HR 74 | Temp 97.7°F | Resp 15 | Ht 60.0 in | Wt 145.0 lb

## 2015-08-30 DIAGNOSIS — M47896 Other spondylosis, lumbar region: Secondary | ICD-10-CM | POA: Diagnosis not present

## 2015-08-30 DIAGNOSIS — M5481 Occipital neuralgia: Secondary | ICD-10-CM | POA: Diagnosis not present

## 2015-08-30 DIAGNOSIS — M5136 Other intervertebral disc degeneration, lumbar region: Secondary | ICD-10-CM

## 2015-08-30 DIAGNOSIS — M542 Cervicalgia: Secondary | ICD-10-CM | POA: Diagnosis present

## 2015-08-30 DIAGNOSIS — M5116 Intervertebral disc disorders with radiculopathy, lumbar region: Secondary | ICD-10-CM | POA: Insufficient documentation

## 2015-08-30 DIAGNOSIS — M533 Sacrococcygeal disorders, not elsewhere classified: Secondary | ICD-10-CM | POA: Diagnosis not present

## 2015-08-30 DIAGNOSIS — M47816 Spondylosis without myelopathy or radiculopathy, lumbar region: Secondary | ICD-10-CM

## 2015-08-30 DIAGNOSIS — M5416 Radiculopathy, lumbar region: Secondary | ICD-10-CM

## 2015-08-30 DIAGNOSIS — R51 Headache: Secondary | ICD-10-CM | POA: Diagnosis present

## 2015-08-30 MED ORDER — OXYCODONE HCL 5 MG PO TABS: ORAL_TABLET | ORAL | Status: DC

## 2015-08-30 NOTE — Patient Instructions (Signed)
PLAN   Continue present medication oxycodone   F/U PCP Dr.George for evaliation of  BP and general medical  condition  F/U surgical evaluation. May consider pending follow-up evaluations  F/U neurological evaluation. May consider PNCV/EMG studies and other studies pending follow-up evaluations  May consider radiofrequency rhizolysis or intraspinal procedures pending response to present treatment and F/U evaluation   Patient to call Pain Management Center should patient have concerns prior to scheduled return appoinment

## 2015-08-30 NOTE — Progress Notes (Signed)
   Subjective:    Patient ID: Sherri Foster, female    DOB: 25-Nov-1960, 55 y.o.   MRN: [id number]  HPI  The patient is a 55 year old female who returns to pain management for further evaluation and treatment of pain. The patient is a pain radiating from the neck to the back of the head causing headaches as well as pain of the mid back and lower back regions recess contributed to pain radiating to the lower extremity regions as well. The patient states that the present time she is with pain well-controlled. The patient is status post bilateral occipital nerve block for treatment of pain occurring the back of neck radiating to the back of the hip precipitating headaches. Patient states that the present time headaches have been very well controlled and patient is had only 2 headaches since previous visit to pain management. We discussed patient's condition and will avoid interventional treatment at time return appointment. Patient is call pain management should they be significant change in condition. Patient states that the lower back lower extremity pain is fairly well-controlled as well. We will remain available to consider additional modifications of treatment pending response to treatment and follow-up evaluation. All agreed to suggested treatment plan. The patient will continue oxycodone as prescribed this time.      Review of Systems     Objective:   Physical Exam   There was tenderness to palpation of paraspinal misreading cervical region cervical facet region of minimal degree with minimal tenderness of the splenius capitis and occipitalis regions. Palpation over the acromioclavicular and glenohumeral joint regions reproduce mild to moderate discomfort. The patient was with slightly increased pain with range of motion maneuvers of the cervical region the patient appeared to be with unremarkable Spurling's maneuver and was with bilaterally equal grip strength with Tinel and Phalen's  maneuver reproducing minimal discomfort. Palpation of the thoracic region thoracic facet region was attends to palpation of mild to moderate degree with lateral bending rotation extension and palpation of the lumbar facets reproducing mild to moderate discomfort. Straight leg raising was tolerated to 30 without increased pain with dorsiflexion noted. There was minimal increased pain with pressure applied to the ileum with patient in lateral decubitus position. Palpation of the region of the PSIS PII S regions reproduced minimal discomfort. There was minimal tenderness of the greater trochanteric region iliotibial band region. No sensory deficit or dermatomal distribution detected. There was negative clonus negative Homans. 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 May consider pending follow-up evaluations  F/U neurological evaluation. May consider PNCV/EMG studies and other studies pending follow-up evaluations  May consider radiofrequency rhizolysis or intraspinal procedures pending response to present treatment and F/U evaluation   Patient to call Pain Management Center should patient have concerns prior to scheduled return appoinment

## 2015-08-30 NOTE — Progress Notes (Signed)
Safety precautions to be maintained throughout the outpatient stay will include: orient to surroundings, keep bed in low position, maintain call bell within reach at all times, provide assistance with transfer out of bed and ambulation.  

## 2015-09-27 ENCOUNTER — Encounter: Payer: Self-pay | Admitting: Pain Medicine

## 2015-09-27 ENCOUNTER — Ambulatory Visit: Payer: Medicare Other | Attending: Pain Medicine | Admitting: Pain Medicine

## 2015-09-27 VITALS — BP 170/86 | HR 62 | Temp 97.8°F | Resp 18 | Ht 60.0 in | Wt 140.0 lb

## 2015-09-27 DIAGNOSIS — R51 Headache: Secondary | ICD-10-CM | POA: Diagnosis present

## 2015-09-27 DIAGNOSIS — M170 Bilateral primary osteoarthritis of knee: Secondary | ICD-10-CM | POA: Diagnosis not present

## 2015-09-27 DIAGNOSIS — M542 Cervicalgia: Secondary | ICD-10-CM | POA: Diagnosis present

## 2015-09-27 DIAGNOSIS — M5416 Radiculopathy, lumbar region: Secondary | ICD-10-CM

## 2015-09-27 DIAGNOSIS — M1710 Unilateral primary osteoarthritis, unspecified knee: Secondary | ICD-10-CM | POA: Insufficient documentation

## 2015-09-27 DIAGNOSIS — M5481 Occipital neuralgia: Secondary | ICD-10-CM

## 2015-09-27 DIAGNOSIS — M5136 Other intervertebral disc degeneration, lumbar region: Secondary | ICD-10-CM

## 2015-09-27 DIAGNOSIS — M179 Osteoarthritis of knee, unspecified: Secondary | ICD-10-CM | POA: Insufficient documentation

## 2015-09-27 DIAGNOSIS — M5116 Intervertebral disc disorders with radiculopathy, lumbar region: Secondary | ICD-10-CM | POA: Insufficient documentation

## 2015-09-27 DIAGNOSIS — M47816 Spondylosis without myelopathy or radiculopathy, lumbar region: Secondary | ICD-10-CM

## 2015-09-27 DIAGNOSIS — M533 Sacrococcygeal disorders, not elsewhere classified: Secondary | ICD-10-CM | POA: Insufficient documentation

## 2015-09-27 DIAGNOSIS — F4310 Post-traumatic stress disorder, unspecified: Secondary | ICD-10-CM

## 2015-09-27 DIAGNOSIS — M47896 Other spondylosis, lumbar region: Secondary | ICD-10-CM | POA: Diagnosis not present

## 2015-09-27 DIAGNOSIS — M545 Low back pain: Secondary | ICD-10-CM | POA: Diagnosis present

## 2015-09-27 MED ORDER — DICLOFENAC SODIUM 1 % TD GEL: TRANSDERMAL | Status: DC

## 2015-09-27 MED ORDER — OXYCODONE HCL 5 MG PO TABS
ORAL_TABLET | ORAL | Status: DC
Start: 2015-09-27 — End: 2015-10-29

## 2015-09-27 NOTE — Patient Instructions (Addendum)
PLAN   Continue present medication oxycodone and BEGIN Voltaren Gel applications of the knee as discussed if tolerated  Genicular nerve blocks to be performed at time of return appointment  F/U PCP Dr.George for evaluation of  BP and general medical  condition. Your blood pressure is elevated today. Please see Dr. Greggory Stallion for evaluation of blood pressure as discussed  F/U surgical evaluation. May consider pending follow-up evaluations  F/U neurological evaluation. May consider PNCV/EMG studies and other studies pending follow-up evaluations  May consider radiofrequency rhizolysis or intraspinal procedures pending response to present treatment and F/U evaluation   Patient to call Pain Management Center should patient have concerns prior to scheduled return appoinmentGENERAL RISKS AND COMPLICATIONS  What are the risk, side effects and possible complications? Generally speaking, most procedures are safe.  However, with any procedure there are risks, side effects, and the possibility of complications.  The risks and complications are dependent upon the sites that are lesioned, or the type of nerve block to be performed.  The closer the procedure is to the spine, the more serious the risks are.  Great care is taken when placing the radio frequency needles, block needles or lesioning probes, but sometimes complications can occur. 1. Infection: Any time there is an injection through the skin, there is a risk of infection.  This is why sterile conditions are used for these blocks.  There are four possible types of infection. 1. Localized skin infection. 2. Central Nervous System Infection-This can be in the form of Meningitis, which can be deadly. 3. Epidural Infections-This can be in the form of an epidural abscess, which can cause pressure inside of the spine, causing compression of the spinal cord with subsequent paralysis. This would require an emergency surgery to decompress, and there are no  guarantees that the patient would recover from the paralysis. 4. Discitis-This is an infection of the intervertebral discs.  It occurs in about 1% of discography procedures.  It is difficult to treat and it may lead to surgery.        2. Pain: the needles have to go through skin and soft tissues, will cause soreness.       3. Damage to internal structures:  The nerves to be lesioned may be near blood vessels or    other nerves which can be potentially damaged.       4. Bleeding: Bleeding is more common if the patient is taking blood thinners such as  aspirin, Coumadin, Ticiid, Plavix, etc., or if he/she have some genetic predisposition  such as hemophilia. Bleeding into the spinal canal can cause compression of the spinal  cord with subsequent paralysis.  This would require an emergency surgery to  decompress and there are no guarantees that the patient would recover from the  paralysis.       5. Pneumothorax:  Puncturing of a lung is a possibility, every time a needle is introduced in  the area of the chest or upper back.  Pneumothorax refers to free air around the  collapsed lung(s), inside of the thoracic cavity (chest cavity).  Another two possible  complications related to a similar event would include: Hemothorax and Chylothorax.   These are variations of the Pneumothorax, where instead of air around the collapsed  lung(s), you may have blood or chyle, respectively.       6. Spinal headaches: They may occur with any procedures in the area of the spine.       7. Persistent CSF (  Cerebro-Spinal Fluid) leakage: This is a rare problem, but may occur  with prolonged intrathecal or epidural catheters either due to the formation of a fistulous  track or a dural tear.       8. Nerve damage: By working so close to the spinal cord, there is always a possibility of  nerve damage, which could be as serious as a permanent spinal cord injury with  paralysis.       9. Death:  Although rare, severe deadly allergic  reactions known as "Anaphylactic  reaction" can occur to any of the medications used.      10. Worsening of the symptoms:  We can always make thing worse.  What are the chances of something like this happening? Chances of any of this occuring are extremely low.  By statistics, you have more of a chance of getting killed in a motor vehicle accident: while driving to the hospital than any of the above occurring .  Nevertheless, you should be aware that they are possibilities.  In general, it is similar to taking a shower.  Everybody knows that you can slip, hit your head and get killed.  Does that mean that you should not shower again?  Nevertheless always keep in mind that statistics do not mean anything if you happen to be on the wrong side of them.  Even if a procedure has a 1 (one) in a 1,000,000 (million) chance of going wrong, it you happen to be that one..Also, keep in mind that by statistics, you have more of a chance of having something go wrong when taking medications.  Who should not have this procedure? If you are on a blood thinning medication (e.g. Coumadin, Plavix, see list of "Blood Thinners"), or if you have an active infection going on, you should not have the procedure.  If you are taking any blood thinners, please inform your physician.  How should I prepare for this procedure?  Do not eat or drink anything at least six hours prior to the procedure.  Bring a driver with you .  It cannot be a taxi.  Come accompanied by an adult that can drive you back, and that is strong enough to help you if your legs get weak or numb from the local anesthetic.  Take all of your medicines the morning of the procedure with just enough water to swallow them.  If you have diabetes, make sure that you are scheduled to have your procedure done first thing in the morning, whenever possible.  If you have diabetes, take only half of your insulin dose and notify our nurse that you have done so as soon  as you arrive at the clinic.  If you are diabetic, but only take blood sugar pills (oral hypoglycemic), then do not take them on the morning of your procedure.  You may take them after you have had the procedure.  Do not take aspirin or any aspirin-containing medications, at least eleven (11) days prior to the procedure.  They may prolong bleeding.  Wear loose fitting clothing that may be easy to take off and that you would not mind if it got stained with Betadine or blood.  Do not wear any jewelry or perfume  Remove any nail coloring.  It will interfere with some of our monitoring equipment.  NOTE: Remember that this is not meant to be interpreted as a complete list of all possible complications.  Unforeseen problems may occur.  BLOOD THINNERS The following drugs  contain aspirin or other products, which can cause increased bleeding during surgery and should not be taken for 2 weeks prior to and 1 week after surgery.  If you should need take something for relief of minor pain, you may take acetaminophen which is found in Tylenol,m Datril, Anacin-3 and Panadol. It is not blood thinner. The products listed below are.  Do not take any of the products listed below in addition to any listed on your instruction sheet.  A.P.C or A.P.C with Codeine Codeine Phosphate Capsules #3 Ibuprofen Ridaura  ABC compound Congesprin Imuran rimadil  Advil Cope Indocin Robaxisal  Alka-Seltzer Effervescent Pain Reliever and Antacid Coricidin or Coricidin-D  Indomethacin Rufen  Alka-Seltzer plus Cold Medicine Cosprin Ketoprofen S-A-C Tablets  Anacin Analgesic Tablets or Capsules Coumadin Korlgesic Salflex  Anacin Extra Strength Analgesic tablets or capsules CP-2 Tablets Lanoril Salicylate  Anaprox Cuprimine Capsules Levenox Salocol  Anexsia-D Dalteparin Magan Salsalate  Anodynos Darvon compound Magnesium Salicylate Sine-off  Ansaid Dasin Capsules Magsal Sodium Salicylate  Anturane Depen Capsules Marnal Soma   APF Arthritis pain formula Dewitt's Pills Measurin Stanback  Argesic Dia-Gesic Meclofenamic Sulfinpyrazone  Arthritis Bayer Timed Release Aspirin Diclofenac Meclomen Sulindac  Arthritis pain formula Anacin Dicumarol Medipren Supac  Analgesic (Safety coated) Arthralgen Diffunasal Mefanamic Suprofen  Arthritis Strength Bufferin Dihydrocodeine Mepro Compound Suprol  Arthropan liquid Dopirydamole Methcarbomol with Aspirin Synalgos  ASA tablets/Enseals Disalcid Micrainin Tagament  Ascriptin Doan's Midol Talwin  Ascriptin A/D Dolene Mobidin Tanderil  Ascriptin Extra Strength Dolobid Moblgesic Ticlid  Ascriptin with Codeine Doloprin or Doloprin with Codeine Momentum Tolectin  Asperbuf Duoprin Mono-gesic Trendar  Aspergum Duradyne Motrin or Motrin IB Triminicin  Aspirin plain, buffered or enteric coated Durasal Myochrisine Trigesic  Aspirin Suppositories Easprin Nalfon Trillsate  Aspirin with Codeine Ecotrin Regular or Extra Strength Naprosyn Uracel  Atromid-S Efficin Naproxen Ursinus  Auranofin Capsules Elmiron Neocylate Vanquish  Axotal Emagrin Norgesic Verin  Azathioprine Empirin or Empirin with Codeine Normiflo Vitamin E  Azolid Emprazil Nuprin Voltaren  Bayer Aspirin plain, buffered or children's or timed BC Tablets or powders Encaprin Orgaran Warfarin Sodium  Buff-a-Comp Enoxaparin Orudis Zorpin  Buff-a-Comp with Codeine Equegesic Os-Cal-Gesic   Buffaprin Excedrin plain, buffered or Extra Strength Oxalid   Bufferin Arthritis Strength Feldene Oxphenbutazone   Bufferin plain or Extra Strength Feldene Capsules Oxycodone with Aspirin   Bufferin with Codeine Fenoprofen Fenoprofen Pabalate or Pabalate-SF   Buffets II Flogesic Panagesic   Buffinol plain or Extra Strength Florinal or Florinal with Codeine Panwarfarin   Buf-Tabs Flurbiprofen Penicillamine   Butalbital Compound Four-way cold tablets Penicillin   Butazolidin Fragmin Pepto-Bismol   Carbenicillin Geminisyn Percodan   Carna  Arthritis Reliever Geopen Persantine   Carprofen Gold's salt Persistin   Chloramphenicol Goody's Phenylbutazone   Chloromycetin Haltrain Piroxlcam   Clmetidine heparin Plaquenil   Cllnoril Hyco-pap Ponstel   Clofibrate Hydroxy chloroquine Propoxyphen         Before stopping any of these medications, be sure to consult the physician who ordered them.  Some, such as Coumadin (Warfarin) are ordered to prevent or treat serious conditions such as "deep thrombosis", "pumonary embolisms", and other heart problems.  The amount of time that you may need off of the medication may also vary with the medication and the reason for which you were taking it.  If you are taking any of these medications, please make sure you notify your pain physician before you undergo any procedures.

## 2015-09-27 NOTE — Progress Notes (Signed)
Safety precautions to be maintained throughout the outpatient stay will include: orient to surroundings, keep bed in low position, maintain call bell within reach at all times, provide assistance with transfer out of bed and ambulation.  

## 2015-09-27 NOTE — Progress Notes (Signed)
   Subjective:    Patient ID: Sherri Foster, female    DOB: 1960/04/12, 55 y.o.   MRN: [id number]  HPI  The patient is a 55 year old female who returns to pain management for further evaluation and treatment of pain involving the region of the neck associated with headaches as well as lower back and lower extremity pain. The patient states she has significant pain involving the region of the knee. The patient is status post fall. We discussed patient's condition including MRI of knee and other studies which patient prefer to delay at this time and wished to proceed with interventional treatment to be performed at time return appointment. The patient will continue oxycodone as prescribed and will call pain management prior to scheduled return appointment should there be significant change in condition or go to the emergency department or see her primary care physician Dr. Greggory StallionGeorge should they be significant change in condition. All agreed to suggested treatment plan.  Review of Systems     Objective:   Physical Exam  There was tenderness to palpation of paraspinal musculature region cervical region cervical facet region palpation which reproduces pain of moderate degree. There was moderate tenderness of the splenius capitis and occipitalis region. The patient appeared to be with bilaterally equal grip strength with Tinel and Phalen's maneuver reproducing minimal discomfort. There was minimal tenderness of the acromioclavicular and glenohumeral joint region and patient was able to perform drop test without significant difficulty. There appeared to be unremarkable Spurling's maneuver as well. There was tenderness of the thoracic region of minimal degree with no crepitus of the thoracic region noted. Palpation over the lumbar region was with tenderness to palpation of moderate degree with lateral bending rotation extension and palpation over the lumbar facets reproducing moderate discomfort. Palpation  over the PSIS and PII S region reproduced mild to moderate discomfort. The knees were attends to palpation with tenderness over the region of the medial and lateral aspects of the knees with crepitus of the knee noted. There was no increased warmth erythema of the knee noted. There appeared to be negative anterior and posterior drawer signs. Palpation of the knee and range of motion maneuvers of the knee reproduces severe disabling pain of the knee. There was mild tenderness of the greater trochanteric region iliotibial band region. 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 Your blood pressure is elevated today. Please see Dr. Greggory StallionGeorge for evaluation of blood pressure as discussed  F/U surgical evaluation. May consider pending follow-up evaluations  F/U neurological evaluation. May consider PNCV/EMG studies and other studies pending follow-up  evaluations  MRI of knee to be considered as discussed  May consider radiofrequency rhizolysis or intraspinal procedures pending response to present treatment and F/U evaluation   Patient to call Pain Management Center should patient have concerns prior to scheduled return appoinment  Bilateral occipital neuralgia

## 2015-10-03 ENCOUNTER — Other Ambulatory Visit: Payer: Self-pay | Admitting: Family Medicine

## 2015-10-03 DIAGNOSIS — N281 Cyst of kidney, acquired: Secondary | ICD-10-CM

## 2015-10-09 ENCOUNTER — Ambulatory Visit: Payer: Medicare Other

## 2015-10-10 ENCOUNTER — Ambulatory Visit: Payer: Medicare Other | Admitting: Pain Medicine

## 2015-10-24 ENCOUNTER — Ambulatory Visit: Payer: Medicare Other | Admitting: Oncology

## 2015-10-25 ENCOUNTER — Ambulatory Visit: Payer: Medicare Other | Admitting: Pain Medicine

## 2015-10-29 ENCOUNTER — Ambulatory Visit: Payer: Medicare Other | Admitting: Pain Medicine

## 2015-10-29 ENCOUNTER — Telehealth: Payer: Self-pay | Admitting: *Deleted

## 2015-10-29 ENCOUNTER — Encounter: Payer: Self-pay | Admitting: Pain Medicine

## 2015-10-29 ENCOUNTER — Ambulatory Visit: Payer: Medicare Other | Attending: Pain Medicine | Admitting: Pain Medicine

## 2015-10-29 ENCOUNTER — Ambulatory Visit: Payer: Medicare Other | Admitting: Oncology

## 2015-10-29 VITALS — BP 148/90 | HR 74 | Temp 98.3°F | Resp 17 | Ht 60.0 in | Wt 146.0 lb

## 2015-10-29 DIAGNOSIS — M5416 Radiculopathy, lumbar region: Secondary | ICD-10-CM

## 2015-10-29 DIAGNOSIS — M5136 Other intervertebral disc degeneration, lumbar region: Secondary | ICD-10-CM

## 2015-10-29 DIAGNOSIS — M533 Sacrococcygeal disorders, not elsewhere classified: Secondary | ICD-10-CM

## 2015-10-29 DIAGNOSIS — M1711 Unilateral primary osteoarthritis, right knee: Secondary | ICD-10-CM | POA: Insufficient documentation

## 2015-10-29 DIAGNOSIS — M545 Low back pain: Secondary | ICD-10-CM | POA: Diagnosis present

## 2015-10-29 DIAGNOSIS — F4310 Post-traumatic stress disorder, unspecified: Secondary | ICD-10-CM

## 2015-10-29 DIAGNOSIS — M47816 Spondylosis without myelopathy or radiculopathy, lumbar region: Secondary | ICD-10-CM

## 2015-10-29 DIAGNOSIS — M5481 Occipital neuralgia: Secondary | ICD-10-CM

## 2015-10-29 DIAGNOSIS — M25561 Pain in right knee: Secondary | ICD-10-CM | POA: Diagnosis present

## 2015-10-29 DIAGNOSIS — M170 Bilateral primary osteoarthritis of knee: Secondary | ICD-10-CM

## 2015-10-29 MED ORDER — BUPIVACAINE HCL (PF) 0.25 % IJ SOLN
30.0000 mL | Freq: Once | INTRAMUSCULAR | Status: AC
  Administered 2015-10-29: 30 mL
  Filled 2015-10-29: qty 30

## 2015-10-29 MED ORDER — DOXYCYCLINE HYCLATE 100 MG PO TABS: 100.0000 mg | ORAL_TABLET | Freq: Two times a day (BID) | ORAL | Status: DC

## 2015-10-29 MED ORDER — OXYCODONE HCL 5 MG PO TABS: ORAL_TABLET | ORAL | 0 refills | Status: DC

## 2015-10-29 MED ORDER — ORPHENADRINE CITRATE 30 MG/ML IJ SOLN
INTRAMUSCULAR | Status: AC
  Administered 2015-10-29: 10:00:00
  Filled 2015-10-29: qty 2

## 2015-10-29 MED ORDER — MIDAZOLAM HCL 5 MG/5ML IJ SOLN
5.0000 mg | Freq: Once | INTRAMUSCULAR | Status: AC
  Administered 2015-10-29: 3 mg via INTRAVENOUS
  Filled 2015-10-29: qty 5

## 2015-10-29 MED ORDER — LIDOCAINE HCL (PF) 1 % IJ SOLN
10.0000 mL | Freq: Once | INTRAMUSCULAR | Status: AC
  Administered 2015-10-29: 10 mL via SUBCUTANEOUS
  Filled 2015-10-29: qty 10

## 2015-10-29 MED ORDER — LACTATED RINGERS IV SOLN
1000.0000 mL | INTRAVENOUS | Status: DC
  Administered 2015-10-29: 1000 mL via INTRAVENOUS

## 2015-10-29 MED ORDER — TRIAMCINOLONE ACETONIDE 40 MG/ML IJ SUSP
40.0000 mg | Freq: Once | INTRAMUSCULAR | Status: AC
  Administered 2015-10-29: 40 mg
  Filled 2015-10-29: qty 1

## 2015-10-29 MED ORDER — FENTANYL CITRATE (PF) 100 MCG/2ML IJ SOLN
100.0000 ug | Freq: Once | INTRAMUSCULAR | Status: AC
  Administered 2015-10-29: 50 ug via INTRAVENOUS
  Filled 2015-10-29: qty 2

## 2015-10-29 NOTE — Patient Instructions (Addendum)
PLAN   Continue present medication oxycodone and apply Voltaren Gel applications of the knee as discussed if tolerated Please obtain doxycycline antibiotic today and begin taking doxycycline antibiotic today as prescribed  F/U PCP Dr.George for evaluation of  BP and general medical  condition  F/U surgical evaluation. May consider pending follow-up evaluations  F/U neurological evaluation. May consider PNCV/EMG studies and other studies pending follow-up evaluations  May consider radiofrequency rhizolysis or intraspinal procedures pending response to present treatment and F/U evaluation   Patient to call Pain Management Center should patient have concerns prior to scheduled return appoinment  Pain Management Discharge Instructions  General Discharge Instructions :  If you need to reach your doctor call: Monday-Friday 8:00 am - 4:00 pm at [phone] or toll free [phone].  After clinic hours [phone] to have operator reach doctor.  Bring all of your medication bottles to all your appointments in the pain clinic.  To cancel or reschedule your appointment with Pain Management please remember to call 24 hours in advance to avoid a fee.  Refer to the educational materials which you have been given on: General Risks, I had my Procedure. Discharge Instructions, Post Sedation.  Post Procedure Instructions:  The drugs you were given will stay in your system until tomorrow, so for the next 24 hours you should not drive, make any legal decisions or drink any alcoholic beverages.  You may eat anything you prefer, but it is better to start with liquids then soups and crackers, and gradually work up to solid foods.  Please notify your doctor immediately if you have any unusual bleeding, trouble breathing or pain that is not related to your normal pain.  Depending on the type of procedure that was done, some parts of your body may feel week and/or numb.  This usually clears up by  tonight or the next day.  Walk with the use of an assistive device or accompanied by an adult for the 24 hours.  You may use ice on the affected area for the first 24 hours.  Put ice in a Ziploc bag and cover with a towel and place against area 15 minutes on 15 minutes off.  You may switch to heat after 24 hours.

## 2015-10-29 NOTE — Progress Notes (Signed)
    Genicular nerve blocks of the right knee   The patient is a 55 y.o. female who returns to the Pain Management Center for further evaluation and treatment of pain involving the lumbar lower extremity region with severe pain of the right knee. Prior studies reveal patient to be with significant degenerative joint disease of the knee.  We will proceed with genicular nerve blocks of the right knee in an attempt to decrease severity of symptoms, minimize the risk of medication escalation, hopefully retard progression of symptoms and avoid the need for more involved treatment.  The risks benefits and expectations of the procedure were discussed with the patient. The patient was with understanding and in agreement with suggested treatment plan.  DESCRIPTION OF PROCEDURE: Genicular nerve blocks of the right knee. The  procedure was performed with IV Versed and IV fentanyl, conscious sedation and under fluoroscopic guidance.  NEEDLE PLACEMENT FOR BLOCK OF THE LATERAL SUPERIOR GENICULAR NERVE: The patient was taking to the fluoroscopy suite. With the patient supine, with knee in flexed position, Betadine prep of proposed entry site accomplished.  IV Versed, IV fentanyl conscious sedation, EKG, blood pressure, pulse, capnography, and pulse oximetry monitoring were all in place. Under fluoroscopic guidance, a 22-gauge needle was inserted in the region of the right knee with needle placed at the lateral border of the femur at the junction of the shaft of the femur and the condyle of the femur.  Following needle placement at the lateral aspect of the knee, needle placement was then accomplished in the region of the medial aspect of the knee.  NEEDLE PLACEMENT FOR BLOCK OF THE MEDIAL SUPERIOR GENICULAR NERVE:  Under fluoroscopic guidance, a 22 - gauge needle was inserted in the region of the right knee with needle placed at the medial border of the femur at the junction of the shaft of the femur and the condyle of  the femur.   NEEDLE PLACEMENT FOR BLOCK OF THE MEDIAL INFERIOR GENICULAR NERVE:  Under fluoroscopic guidance, a 22 - gauge needle was inserted in the region of the right knee with needle placed at the junction of the shaft and plateau of the tibia.   Following needle placement on AP view of needles placed in all three locations, placement was then verified on lateral view with the tips of the superior lateral and superior medial needles documented to be one half the distance of the shaft of the femur and the tip of the inferior medial geniculate needle documented to be one half the distance of the shaft of the tibia.  Following documentation of needle placements on lateral view, each needle was injected with one mL of 0.25% bupivacaine with Kenalog. A total of 10 mg of Kenalog was utilized for the entire procedure. The patient tolerated the procedure well.    PLAN 1. Medications: Continue present medication oxycodone 2. Follow-up appointment with PCP Dr. Greggory StallionGeorge for evaluation of blood pressure and general medical condition. 3. Follow-up surgical evaluation as discussed 4. Follow-up neurological evaluation. May consider as discussed 5. The patient may be a candidate for radiofrequency rhizolysis and other treatment pending response to treatment on today's visit and follow-up evaluation. 6. The patient is advised to adhere to proper body mechanics and avoid activities which appear to aggravate condition

## 2015-10-30 ENCOUNTER — Telehealth: Payer: Self-pay | Admitting: *Deleted

## 2015-10-30 NOTE — Telephone Encounter (Signed)
Tried to reach patient, no voicemail capability. 

## 2015-10-31 ENCOUNTER — Ambulatory Visit: Payer: Medicare Other | Admitting: Oncology

## 2015-11-07 ENCOUNTER — Ambulatory Visit: Payer: Medicare Other | Admitting: Pain Medicine

## 2015-11-12 ENCOUNTER — Other Ambulatory Visit: Payer: Self-pay | Admitting: Pain Medicine

## 2015-11-12 ENCOUNTER — Ambulatory Visit: Payer: Medicare Other

## 2015-11-13 ENCOUNTER — Ambulatory Visit: Payer: Medicare Other

## 2015-11-16 ENCOUNTER — Telehealth: Payer: Self-pay | Admitting: *Deleted

## 2015-12-06 ENCOUNTER — Encounter: Payer: Medicare Other | Admitting: Pain Medicine

## 2015-12-10 DIAGNOSIS — D72829 Elevated white blood cell count, unspecified: Secondary | ICD-10-CM | POA: Insufficient documentation

## 2015-12-10 NOTE — Progress Notes (Signed)
[place]  [phone]) [phone] [phone]) [phone]  ID: Orlie Dakin OB: 07/08/1960  MR#: [id number]  BMW#:[id number]  Patient Care Team: Sharyne Peach, MD as PCP - General (Family Medicine)  CHIEF COMPLAINT: Leukocytosis, unspecified; polycythemia, secondary  INTERVAL HISTORY: Patient is a 55 year old female with multiple medical problems who was previously evaluated in clinic for both polycythemia as well as leukocytosis. She indicates that she may have received phlebotomy in the past, but this is unclear. She is referred back for further evaluation and continuation of treatment. Currently she feels well and is at her baseline. She is no neurologic complaints. She denies any recent fevers or illnesses. She has a good appetite and denies weight loss. She denies any chest pain or shortness of breath. She has no nausea, vomiting, constipation, or diarrhea. She has no urinary complaints. Patient offers no specific complaints today.  REVIEW OF SYSTEMS:   Review of Systems  Constitutional: Negative.  Negative for fever, malaise/fatigue and weight loss.  Respiratory: Negative.  Negative for cough and shortness of breath.   Cardiovascular: Negative.  Negative for chest pain and leg swelling.  Gastrointestinal: Negative.  Negative for abdominal pain.  Genitourinary: Negative.   Musculoskeletal: Negative.   Neurological: Negative.  Negative for weakness.  Psychiatric/Behavioral: Negative.     As per HPI. Otherwise, a complete review of systems is negative.  PAST MEDICAL HISTORY: Past Medical History:  Diagnosis Date  . Allergy   . Arthritis   . Back pain   . Bipolar 1 disorder ([place])   . Blood transfusion without reported diagnosis   . COPD (chronic obstructive pulmonary disease) ([place])   . Depression   . Hepatitis C   . Hyperlipidemia   . Hypertension   . Opiate use    long term use of opiate analgesics.  . Seizures ([place])     PAST SURGICAL  HISTORY: Past Surgical History:  Procedure Laterality Date  . ABDOMINAL HYSTERECTOMY      FAMILY HISTORY: Family History  Problem Relation Age of Onset  . Arthritis Mother   . Diabetes Mother   . Hyperlipidemia Mother   . Hypertension Mother   . Miscarriages / [place] Mother     ADVANCED DIRECTIVES (Y/N):  N  HEALTH MAINTENANCE: Social History  Substance Use Topics  . Smoking status: Current Every Day Smoker    Packs/day: 0.30    Years: 40.00    Types: Cigarettes  . Smokeless tobacco: Never Used     Comment: 3 na day  . Alcohol use No     Colonoscopy:  PAP:  Bone density:  Lipid panel:  Allergies  Allergen Reactions  . Amitriptyline Anaphylaxis  . Other Shortness Of Breath and Itching    ragweed  . Potassium Chloride Shortness Of Breath  . Potassium-Containing Compounds Shortness Of Breath  . Sulfa Antibiotics Other (See Comments)  . Topical Sulfur Other (See Comments)    Reaction:  Unknown   . Aspirin Rash and Other (See Comments)    Breaks mouth out Other reaction(s): OTHER all over my body  . Codeine Rash    codeine with tylenol  . Latex Rash    Other reaction(s): UNKNOWN Other reaction(s): UNKNOWN  . Topiramate Nausea And Vomiting  . Tylenol [Acetaminophen] Rash    Current Outpatient Prescriptions  Medication Sig Dispense Refill  . albuterol (PROVENTIL HFA;VENTOLIN HFA) 108 (90 BASE) MCG/ACT inhaler Inhale 2 puffs into the lungs every 6 (six) hours as needed for wheezing or shortness of breath.    Marland Kitchen  cetirizine (ZYRTEC) 10 MG tablet Take 10 mg by mouth daily.    . diclofenac sodium (VOLTAREN) 1 % GEL Apply 2-4 g to painful area of the knee 4 times per day if tolerated. Patient with strain, sprain, and spasms of the knee 500 g 0  . EPINEPHrine 0.3 mg/0.3 mL IJ SOAJ injection Inject 0.3 mg into the muscle once as needed (for severe allergic reaction).     . FLUoxetine (PROZAC) 20 MG capsule Take 20 mg by mouth daily.    . fluticasone (FLONASE) 50  MCG/ACT nasal spray Place 2 sprays into both nostrils daily.    . Fluticasone-Salmeterol (ADVAIR) 250-50 MCG/DOSE AEPB Inhale 1 puff into the lungs every 12 (twelve) hours.    . gabapentin (NEURONTIN) 300 MG capsule Take 300 mg by mouth 3 (three) times daily.    . hydrochlorothiazide (HYDRODIURIL) 12.5 MG tablet Take 6.25 mg by mouth daily. Reported on 09/27/2015    . ipratropium-albuterol (DUONEB) 0.5-2.5 (3) MG/3ML SOLN Take 3 mLs by nebulization every 4 (four) hours as needed (for shortness of breath).     . levETIRAcetam (KEPPRA) 500 MG tablet Take 500 mg by mouth 2 (two) times daily.    . nicotine (NICODERM CQ - DOSED IN MG/24 HOURS) 21 mg/24hr patch Place 21 mg onto the skin daily. Reported on 09/27/2015    . oxyCODONE (ROXICODONE) 5 MG immediate release tablet Limit 1 tab by mouth per day or 2-3 times per day if tolerated 90 tablet 0  . simvastatin (ZOCOR) 40 MG tablet Take 40 mg by mouth at bedtime.     . tiZANidine (ZANAFLEX) 2 MG tablet Take 2 mg by mouth daily.     . traZODone (DESYREL) 150 MG tablet Take 150 mg by mouth at bedtime.     Current Facility-Administered Medications  Medication Dose Route Frequency Provider Last Rate Last Dose  . bupivacaine (PF) (MARCAINE) 0.25 % injection 30 mL  30 mL Other Once Gregory Crisp, MD      . bupivacaine (PF) (MARCAINE) 0.25 % injection 30 mL  30 mL Other Once Gregory Crisp, MD      . fentaNYL (SUBLIMAZE) injection 100 mcg  100 mcg Intravenous Once Gregory Crisp, MD      . fentaNYL (SUBLIMAZE) injection 100 mcg  100 mcg Intravenous Once Gregory Crisp, MD      . lactated ringers infusion 1,000 mL  1,000 mL Intravenous Continuous Gregory Crisp, MD      . lactated ringers infusion 1,000 mL  1,000 mL Intravenous Continuous Gregory Crisp, MD      . lactated ringers infusion 1,000 mL  1,000 mL Intravenous Continuous Gregory Crisp, MD      . lactated ringers infusion 1,000 mL  1,000 mL Intravenous Continuous Gregory Crisp, MD      . lactated ringers  infusion 1,000 mL  1,000 mL Intravenous Continuous Gregory Crisp, MD      . lactated ringers infusion 1,000 mL  1,000 mL Intravenous Continuous Gregory Crisp, MD 125 mL/hr at 10/29/15 0810 1,000 mL at 10/29/15 0810  . lidocaine (PF) (XYLOCAINE) 1 % injection 10 mL  10 mL Subcutaneous Once Gregory Crisp, MD      . lidocaine (PF) (XYLOCAINE) 1 % injection 10 mL  10 mL Subcutaneous Once Gregory Crisp, MD      . midazolam (VERSED) 5 MG/5ML injection 5 mg  5 mg Intravenous Once Gregory Crisp, MD      . midazolam (VERSED) 5 MG/5ML injection 5 mg  5 mg   Intravenous Once Mohammed Kindle, MD      . orphenadrine (NORFLEX) injection 60 mg  60 mg Intramuscular Once Mohammed Kindle, MD      . orphenadrine (NORFLEX) injection 60 mg  60 mg Intramuscular Once Mohammed Kindle, MD      . orphenadrine (NORFLEX) injection 60 mg  60 mg Intramuscular Once Mohammed Kindle, MD      . orphenadrine (NORFLEX) injection 60 mg  60 mg Intramuscular Once Mohammed Kindle, MD      . sodium chloride 0.9 % injection 20 mL  20 mL Other Once Mohammed Kindle, MD      . triamcinolone acetonide [facility]) injection 40 mg  40 mg Other Once Mohammed Kindle, MD      . triamcinolone acetonide [facility]) injection 40 mg  40 mg Other Once Mohammed Kindle, MD        OBJECTIVE: Vitals:   12/11/15 1134  BP: (!) 152/93  Pulse: 90  Temp: 97.6 F (36.4 C)     Body mass index is 28.03 kg/m.    ECOG FS:0 - Asymptomatic  General: Well-developed, well-nourished, no acute distress. Eyes: Pink conjunctiva, anicteric sclera. HEENT: Normocephalic, moist mucous membranes, clear oropharnyx. Lungs: Clear to auscultation bilaterally. Heart: Regular rate and rhythm. No rubs, murmurs, or gallops. Abdomen: Soft, nontender, nondistended. No organomegaly noted, normoactive bowel sounds. Musculoskeletal: No edema, cyanosis, or clubbing. Neuro: Alert, answering all questions appropriately. Cranial nerves grossly intact. Skin: No rashes or petechiae noted. Psych:  Normal affect. Lymphatics: No cervical, calvicular, axillary or inguinal LAD.   LAB RESULTS:  Lab Results  Component Value Date   NA 138 06/09/2015   K 3.6 06/09/2015   CL 106 06/09/2015   CO2 24 06/09/2015   GLUCOSE 116 (H) 06/09/2015   BUN 6 06/09/2015   CREATININE 0.81 06/09/2015   CALCIUM 9.3 06/09/2015   PROT 7.3 06/09/2015   ALBUMIN 4.0 06/09/2015   AST 20 06/09/2015   ALT 14 06/09/2015   ALKPHOS 85 06/09/2015   BILITOT 0.6 06/09/2015   GFRNONAA >60 06/09/2015   GFRAA >60 06/09/2015    Lab Results  Component Value Date   WBC 12.0 (H) 12/11/2015   NEUTROABS 6.5 01/26/2014   HGB 16.4 (H) 12/11/2015   HCT 48.4 (H) 12/11/2015   MCV 91.7 12/11/2015   PLT 279 12/11/2015     STUDIES: No results found.  ASSESSMENT: Leukocytosis, unspecified; polycythemia, secondary.   PLAN:    1. Leukocytosis, unspecified:  Patient's white blood cell count is mildly elevated today, but essentially unchanged for greater than 18 months. No intervention is needed at this time. Peripheral blood flow cytometry was sent for completeness. Patient does not require bone marrow biopsy at this time. Return to clinic in 3 months with repeat laboratory work and further evaluation. 2. Polycythemia, secondary: Unclear etiology, but possibly secondary to heavy tobacco use. Patient reports she was receiving phlebotomy in the past, but we do not have documentation of this. Her hemoglobin is mildly elevated today and she does not require phlebotomy. JAK-2 mutation, iron stores, and erythropoietin levels are pending at time of dictation. 3. Hypertension: Patient's blood pressure is mildly elevated today, monitor.  Patient expressed understanding and was in agreement with this plan. She also understands that She can call clinic at any time with any questions, concerns, or complaints.    Lloyd Huger, MD   12/11/2015 1:24 PM

## 2015-12-11 ENCOUNTER — Inpatient Hospital Stay: Payer: Medicare Other | Attending: Oncology | Admitting: Oncology

## 2015-12-11 ENCOUNTER — Encounter (INDEPENDENT_AMBULATORY_CARE_PROVIDER_SITE_OTHER): Payer: Self-pay

## 2015-12-11 ENCOUNTER — Encounter: Payer: Self-pay | Admitting: Oncology

## 2015-12-11 ENCOUNTER — Inpatient Hospital Stay: Payer: Medicare Other

## 2015-12-11 VITALS — BP 152/93 | HR 90 | Temp 97.6°F | Wt 143.5 lb

## 2015-12-11 DIAGNOSIS — I10 Essential (primary) hypertension: Secondary | ICD-10-CM | POA: Insufficient documentation

## 2015-12-11 DIAGNOSIS — E785 Hyperlipidemia, unspecified: Secondary | ICD-10-CM | POA: Insufficient documentation

## 2015-12-11 DIAGNOSIS — F32A Depression, unspecified: Secondary | ICD-10-CM | POA: Insufficient documentation

## 2015-12-11 DIAGNOSIS — Z23 Encounter for immunization: Secondary | ICD-10-CM | POA: Insufficient documentation

## 2015-12-11 DIAGNOSIS — F17210 Nicotine dependence, cigarettes, uncomplicated: Secondary | ICD-10-CM | POA: Insufficient documentation

## 2015-12-11 DIAGNOSIS — B1920 Unspecified viral hepatitis C without hepatic coma: Secondary | ICD-10-CM | POA: Diagnosis not present

## 2015-12-11 DIAGNOSIS — Z79899 Other long term (current) drug therapy: Secondary | ICD-10-CM | POA: Diagnosis not present

## 2015-12-11 DIAGNOSIS — J449 Chronic obstructive pulmonary disease, unspecified: Secondary | ICD-10-CM | POA: Diagnosis not present

## 2015-12-11 DIAGNOSIS — G40109 Localization-related (focal) (partial) symptomatic epilepsy and epileptic syndromes with simple partial seizures, not intractable, without status epilepticus: Secondary | ICD-10-CM | POA: Insufficient documentation

## 2015-12-11 DIAGNOSIS — D751 Secondary polycythemia: Secondary | ICD-10-CM

## 2015-12-11 DIAGNOSIS — F319 Bipolar disorder, unspecified: Secondary | ICD-10-CM | POA: Insufficient documentation

## 2015-12-11 DIAGNOSIS — D72829 Elevated white blood cell count, unspecified: Secondary | ICD-10-CM | POA: Diagnosis present

## 2015-12-11 DIAGNOSIS — B182 Chronic viral hepatitis C: Secondary | ICD-10-CM | POA: Insufficient documentation

## 2015-12-11 DIAGNOSIS — J45909 Unspecified asthma, uncomplicated: Secondary | ICD-10-CM | POA: Insufficient documentation

## 2015-12-11 DIAGNOSIS — M1990 Unspecified osteoarthritis, unspecified site: Secondary | ICD-10-CM | POA: Diagnosis not present

## 2015-12-11 DIAGNOSIS — F329 Major depressive disorder, single episode, unspecified: Secondary | ICD-10-CM | POA: Insufficient documentation

## 2015-12-11 LAB — CBC
HCT: 48.4 % — ABNORMAL HIGH (ref 35.0–47.0)
Hemoglobin: 16.4 g/dL — ABNORMAL HIGH (ref 12.0–16.0)
MCH: 31.1 pg (ref 26.0–34.0)
MCHC: 33.9 g/dL (ref 32.0–36.0)
MCV: 91.7 fL (ref 80.0–100.0)
PLATELETS: 279 10*3/uL (ref 150–440)
RBC: 5.28 MIL/uL — AB (ref 3.80–5.20)
RDW: 13.3 % (ref 11.5–14.5)
WBC: 12 10*3/uL — ABNORMAL HIGH (ref 3.6–11.0)

## 2015-12-11 LAB — FERRITIN: Ferritin: 163 ng/mL (ref 11–307)

## 2015-12-11 LAB — IRON AND TIBC
Iron: 104 ug/dL (ref 28–170)
SATURATION RATIOS: 30 % (ref 10.4–31.8)
TIBC: 349 ug/dL (ref 250–450)
UIBC: 245 ug/dL

## 2015-12-12 LAB — ERYTHROPOIETIN: Erythropoietin: 5.9 m[IU]/mL (ref 2.6–18.5)

## 2015-12-16 LAB — COMP PANEL: LEUKEMIA/LYMPHOMA

## 2015-12-17 LAB — JAK2 GENOTYPR

## 2015-12-17 LAB — HEMOCHROMATOSIS DNA-PCR(C282Y,H63D)

## 2015-12-24 ENCOUNTER — Ambulatory Visit: Payer: Medicare Other

## 2016-01-01 ENCOUNTER — Ambulatory Visit: Admission: RE | Admit: 2016-01-01 | Payer: Medicare Other | Source: Ambulatory Visit

## 2016-01-03 ENCOUNTER — Other Ambulatory Visit: Payer: Self-pay | Admitting: Pain Medicine

## 2016-01-03 ENCOUNTER — Ambulatory Visit: Payer: Medicare Other | Admitting: Pain Medicine

## 2016-02-19 ENCOUNTER — Ambulatory Visit: Admission: RE | Admit: 2016-02-19 | Payer: Medicare Other | Source: Ambulatory Visit

## 2016-03-17 NOTE — Progress Notes (Deleted)
[place]  [phone] Fax:(336) [phone]  ID: Orlie Dakin OB: 1961/03/05  MR#: [id number]  LTJ#:[id number]  Patient Care Team: Sharyne Peach, MD as PCP - General (Family Medicine)  CHIEF COMPLAINT: Leukocytosis, unspecified; polycythemia, secondary  INTERVAL HISTORY: Patient is a 56 year old female with multiple medical problems who was previously evaluated in clinic for both polycythemia as well as leukocytosis. She indicates that she may have received phlebotomy in the past, but this is unclear. She is referred back for further evaluation and continuation of treatment. Currently she feels well and is at her baseline. She is no neurologic complaints. She denies any recent fevers or illnesses. She has a good appetite and denies weight loss. She denies any chest pain or shortness of breath. She has no nausea, vomiting, constipation, or diarrhea. She has no urinary complaints. Patient offers no specific complaints today.  REVIEW OF SYSTEMS:   Review of Systems  Constitutional: Negative.  Negative for fever, malaise/fatigue and weight loss.  Respiratory: Negative.  Negative for cough and shortness of breath.   Cardiovascular: Negative.  Negative for chest pain and leg swelling.  Gastrointestinal: Negative.  Negative for abdominal pain.  Genitourinary: Negative.   Musculoskeletal: Negative.   Neurological: Negative.  Negative for weakness.  Psychiatric/Behavioral: Negative.     As per HPI. Otherwise, a complete review of systems is negative.  PAST MEDICAL HISTORY: Past Medical History:  Diagnosis Date  . Allergy   . Arthritis   . Back pain   . Bipolar 1 disorder ([place])   . Blood transfusion without reported diagnosis   . COPD (chronic obstructive pulmonary disease) ([place])   . Depression   . Hepatitis C   . Hyperlipidemia   . Hypertension   . Opiate use    long term use of opiate analgesics.  . Seizures ([place])     PAST SURGICAL  HISTORY: Past Surgical History:  Procedure Laterality Date  . ABDOMINAL HYSTERECTOMY      FAMILY HISTORY: Family History  Problem Relation Age of Onset  . Arthritis Mother   . Diabetes Mother   . Hyperlipidemia Mother   . Hypertension Mother   . Miscarriages / [place] Mother     ADVANCED DIRECTIVES (Y/N):  N  HEALTH MAINTENANCE: Social History  Substance Use Topics  . Smoking status: Current Every Day Smoker    Packs/day: 0.30    Years: 40.00    Types: Cigarettes  . Smokeless tobacco: Never Used     Comment: 3 na day  . Alcohol use No     Colonoscopy:  PAP:  Bone density:  Lipid panel:  Allergies  Allergen Reactions  . Amitriptyline Anaphylaxis  . Other Shortness Of Breath and Itching    ragweed  . Potassium Chloride Shortness Of Breath  . Potassium-Containing Compounds Shortness Of Breath  . Sulfa Antibiotics Other (See Comments)  . Topical Sulfur Other (See Comments)    Reaction:  Unknown   . Aspirin Rash and Other (See Comments)    Breaks mouth out Other reaction(s): OTHER all over my body  . Codeine Rash    codeine with tylenol  . Latex Rash    Other reaction(s): UNKNOWN Other reaction(s): UNKNOWN  . Topiramate Nausea And Vomiting  . Tylenol [Acetaminophen] Rash    Current Outpatient Prescriptions  Medication Sig Dispense Refill  . albuterol (PROVENTIL HFA;VENTOLIN HFA) 108 (90 BASE) MCG/ACT inhaler Inhale 2 puffs into the lungs every 6 (six) hours as needed for wheezing or shortness of breath.    Marland Kitchen  cetirizine (ZYRTEC) 10 MG tablet Take 10 mg by mouth daily.    . diclofenac sodium (VOLTAREN) 1 % GEL Apply 2-4 g to painful area of the knee 4 times per day if tolerated. Patient with strain, sprain, and spasms of the knee 500 g 0  . EPINEPHrine 0.3 mg/0.3 mL IJ SOAJ injection Inject 0.3 mg into the muscle once as needed (for severe allergic reaction).     Marland Kitchen FLUoxetine (PROZAC) 20 MG capsule Take 20 mg by mouth daily.    . fluticasone (FLONASE) 50  MCG/ACT nasal spray Place 2 sprays into both nostrils daily.    . Fluticasone-Salmeterol (ADVAIR) 250-50 MCG/DOSE AEPB Inhale 1 puff into the lungs every 12 (twelve) hours.    . gabapentin (NEURONTIN) 300 MG capsule Take 300 mg by mouth 3 (three) times daily.    . hydrochlorothiazide (HYDRODIURIL) 12.5 MG tablet Take 6.25 mg by mouth daily. Reported on 09/27/2015    . ipratropium-albuterol (DUONEB) 0.5-2.5 (3) MG/3ML SOLN Take 3 mLs by nebulization every 4 (four) hours as needed (for shortness of breath).     . levETIRAcetam (KEPPRA) 500 MG tablet Take 500 mg by mouth 2 (two) times daily.    . nicotine (NICODERM CQ - DOSED IN MG/24 HOURS) 21 mg/24hr patch Place 21 mg onto the skin daily. Reported on 09/27/2015    . oxyCODONE (ROXICODONE) 5 MG immediate release tablet Limit 1 tab by mouth per day or 2-3 times per day if tolerated 90 tablet 0  . simvastatin (ZOCOR) 40 MG tablet Take 40 mg by mouth at bedtime.     Marland Kitchen tiZANidine (ZANAFLEX) 2 MG tablet Take 2 mg by mouth daily.     . traZODone (DESYREL) 150 MG tablet Take 150 mg by mouth at bedtime.     Current Facility-Administered Medications  Medication Dose Route Frequency Provider Last Rate Last Dose  . bupivacaine (PF) (MARCAINE) 0.25 % injection 30 mL  30 mL Other Once Mohammed Kindle, MD      . bupivacaine (PF) (MARCAINE) 0.25 % injection 30 mL  30 mL Other Once Mohammed Kindle, MD      . fentaNYL (SUBLIMAZE) injection 100 mcg  100 mcg Intravenous Once Mohammed Kindle, MD      . fentaNYL (SUBLIMAZE) injection 100 mcg  100 mcg Intravenous Once Mohammed Kindle, MD      . lactated ringers infusion 1,000 mL  1,000 mL Intravenous Continuous Mohammed Kindle, MD      . lactated ringers infusion 1,000 mL  1,000 mL Intravenous Continuous Mohammed Kindle, MD      . lactated ringers infusion 1,000 mL  1,000 mL Intravenous Continuous Mohammed Kindle, MD      . lactated ringers infusion 1,000 mL  1,000 mL Intravenous Continuous Mohammed Kindle, MD      . lactated ringers  infusion 1,000 mL  1,000 mL Intravenous Continuous Mohammed Kindle, MD      . lactated ringers infusion 1,000 mL  1,000 mL Intravenous Continuous Mohammed Kindle, MD   1,000 mL at 10/29/15 0810  . lidocaine (PF) (XYLOCAINE) 1 % injection 10 mL  10 mL Subcutaneous Once Mohammed Kindle, MD      . lidocaine (PF) (XYLOCAINE) 1 % injection 10 mL  10 mL Subcutaneous Once Mohammed Kindle, MD      . midazolam (VERSED) 5 MG/5ML injection 5 mg  5 mg Intravenous Once Mohammed Kindle, MD      . midazolam (VERSED) 5 MG/5ML injection 5 mg  5 mg Intravenous Once Belenda Cruise  Primus Bravo, MD      . orphenadrine (NORFLEX) injection 60 mg  60 mg Intramuscular Once Mohammed Kindle, MD      . orphenadrine (NORFLEX) injection 60 mg  60 mg Intramuscular Once Mohammed Kindle, MD      . orphenadrine (NORFLEX) injection 60 mg  60 mg Intramuscular Once Mohammed Kindle, MD      . orphenadrine (NORFLEX) injection 60 mg  60 mg Intramuscular Once Mohammed Kindle, MD      . sodium chloride 0.9 % injection 20 mL  20 mL Other Once Mohammed Kindle, MD      . triamcinolone acetonide (KENALOG-40) injection 40 mg  40 mg Other Once Mohammed Kindle, MD      . triamcinolone acetonide (KENALOG-40) injection 40 mg  40 mg Other Once Mohammed Kindle, MD        OBJECTIVE: There were no vitals filed for this visit.   There is no height or weight on file to calculate BMI.    ECOG FS:0 - Asymptomatic  General: Well-developed, well-nourished, no acute distress. Eyes: Pink conjunctiva, anicteric sclera. HEENT: Normocephalic, moist mucous membranes, clear oropharnyx. Lungs: Clear to auscultation bilaterally. Heart: Regular rate and rhythm. No rubs, murmurs, or gallops. Abdomen: Soft, nontender, nondistended. No organomegaly noted, normoactive bowel sounds. Musculoskeletal: No edema, cyanosis, or clubbing. Neuro: Alert, answering all questions appropriately. Cranial nerves grossly intact. Skin: No rashes or petechiae noted. Psych: Normal affect. Lymphatics: No cervical,  calvicular, axillary or inguinal LAD.   LAB RESULTS:  Lab Results  Component Value Date   NA 138 06/09/2015   K 3.6 06/09/2015   CL 106 06/09/2015   CO2 24 06/09/2015   GLUCOSE 116 (H) 06/09/2015   BUN 6 06/09/2015   CREATININE 0.81 06/09/2015   CALCIUM 9.3 06/09/2015   PROT 7.3 06/09/2015   ALBUMIN 4.0 06/09/2015   AST 20 06/09/2015   ALT 14 06/09/2015   ALKPHOS 85 06/09/2015   BILITOT 0.6 06/09/2015   GFRNONAA >60 06/09/2015   GFRAA >60 06/09/2015    Lab Results  Component Value Date   WBC 12.0 (H) 12/11/2015   NEUTROABS 6.5 01/26/2014   HGB 16.4 (H) 12/11/2015   HCT 48.4 (H) 12/11/2015   MCV 91.7 12/11/2015   PLT 279 12/11/2015     STUDIES: No results found.  ASSESSMENT: Leukocytosis, unspecified; polycythemia, secondary.   PLAN:    1. Leukocytosis, unspecified:  Patient's white blood cell count is mildly elevated today, but essentially unchanged for greater than 18 months. No intervention is needed at this time. Peripheral blood flow cytometry was sent for completeness. Patient does not require bone marrow biopsy at this time. Return to clinic in 3 months with repeat laboratory work and further evaluation. 2. Polycythemia, secondary: Unclear etiology, but possibly secondary to heavy tobacco use. Patient reports she was receiving phlebotomy in the past, but we do not have documentation of this. Her hemoglobin is mildly elevated today and she does not require phlebotomy. JAK-2 mutation, iron stores, and erythropoietin levels are pending at time of dictation. 3. Hypertension: Patient's blood pressure is mildly elevated today, monitor.  Patient expressed understanding and was in agreement with this plan. She also understands that She can call clinic at any time with any questions, concerns, or complaints.    Lloyd Huger, MD   03/17/2016 9:32 PM

## 2016-03-18 ENCOUNTER — Other Ambulatory Visit: Payer: Self-pay | Admitting: *Deleted

## 2016-03-18 DIAGNOSIS — D751 Secondary polycythemia: Secondary | ICD-10-CM

## 2016-03-18 DIAGNOSIS — D72829 Elevated white blood cell count, unspecified: Secondary | ICD-10-CM

## 2016-03-19 ENCOUNTER — Inpatient Hospital Stay: Payer: Medicare Other | Admitting: Oncology

## 2016-03-19 ENCOUNTER — Inpatient Hospital Stay: Payer: Medicare Other

## 2016-03-21 ENCOUNTER — Ambulatory Visit: Payer: Medicare Other | Attending: Family Medicine

## 2016-04-06 NOTE — Progress Notes (Deleted)
[place]  [phone] [phone]  ID: Sherri Foster OB: [birth year]  MR#: [id number]  BJS#:[id number]  Patient Care Team: Sharyne Peach, MD as PCP - General (Family Medicine)  CHIEF COMPLAINT: Leukocytosis, unspecified; polycythemia, secondary  INTERVAL HISTORY: Patient is a 56 year old female with multiple medical problems who was previously evaluated in clinic for both polycythemia as well as leukocytosis. She indicates that she may have received phlebotomy in the past, but this is unclear. She is referred back for further evaluation and continuation of treatment. Currently she feels well and is at her baseline. She is no neurologic complaints. She denies any recent fevers or illnesses. She has a good appetite and denies weight loss. She denies any chest pain or shortness of breath. She has no nausea, vomiting, constipation, or diarrhea. She has no urinary complaints. Patient offers no specific complaints today.  REVIEW OF SYSTEMS:   Review of Systems  Constitutional: Negative.  Negative for fever, malaise/fatigue and weight loss.  Respiratory: Negative.  Negative for cough and shortness of breath.   Cardiovascular: Negative.  Negative for chest pain and leg swelling.  Gastrointestinal: Negative.  Negative for abdominal pain.  Genitourinary: Negative.   Musculoskeletal: Negative.   Neurological: Negative.  Negative for weakness.  Psychiatric/Behavioral: Negative.     As per HPI. Otherwise, a complete review of systems is negative.  PAST MEDICAL HISTORY: Past Medical History:  Diagnosis Date  . Allergy   . Arthritis   . Back pain   . Bipolar 1 disorder ([place])   . Blood transfusion without reported diagnosis   . COPD (chronic obstructive pulmonary disease) ([place])   . Depression   . Hepatitis C   . Hyperlipidemia   . Hypertension   . Opiate use    long term use of opiate analgesics.  . Seizures ([place])     PAST SURGICAL  HISTORY: Past Surgical History:  Procedure Laterality Date  . ABDOMINAL HYSTERECTOMY      FAMILY HISTORY: Family History  Problem Relation Age of Onset  . Arthritis Mother   . Diabetes Mother   . Hyperlipidemia Mother   . Hypertension Mother   . Miscarriages / [place] Mother     ADVANCED DIRECTIVES (Y/N):  N  HEALTH MAINTENANCE: Social History  Substance Use Topics  . Smoking status: Current Every Day Smoker    Packs/day: 0.30    Years: 40.00    Types: Cigarettes  . Smokeless tobacco: Never Used     Comment: 3 na day  . Alcohol use No     Colonoscopy:  PAP:  Bone density:  Lipid panel:  Allergies  Allergen Reactions  . Amitriptyline Anaphylaxis  . Other Shortness Of Breath and Itching    ragweed  . Potassium Chloride Shortness Of Breath  . Potassium-Containing Compounds Shortness Of Breath  . Sulfa Antibiotics Other (See Comments)  . Topical Sulfur Other (See Comments)    Reaction:  Unknown   . Aspirin Rash and Other (See Comments)    Breaks mouth out Other reaction(s): OTHER all over my body  . Codeine Rash    codeine with tylenol  . Latex Rash    Other reaction(s): UNKNOWN Other reaction(s): UNKNOWN  . Topiramate Nausea And Vomiting  . Tylenol [Acetaminophen] Rash    Current Outpatient Prescriptions  Medication Sig Dispense Refill  . albuterol (PROVENTIL HFA;VENTOLIN HFA) 108 (90 BASE) MCG/ACT inhaler Inhale 2 puffs into the lungs every 6 (six) hours as needed for wheezing or shortness of breath.    Marland Kitchen  cetirizine (ZYRTEC) 10 MG tablet Take 10 mg by mouth daily.    . diclofenac sodium (VOLTAREN) 1 % GEL Apply 2-4 g to painful area of the knee 4 times per day if tolerated. Patient with strain, sprain, and spasms of the knee 500 g 0  . EPINEPHrine 0.3 mg/0.3 mL IJ SOAJ injection Inject 0.3 mg into the muscle once as needed (for severe allergic reaction).     . FLUoxetine (PROZAC) 20 MG capsule Take 20 mg by mouth daily.    . fluticasone (FLONASE) 50  MCG/ACT nasal spray Place 2 sprays into both nostrils daily.    . Fluticasone-Salmeterol (ADVAIR) 250-50 MCG/DOSE AEPB Inhale 1 puff into the lungs every 12 (twelve) hours.    . gabapentin (NEURONTIN) 300 MG capsule Take 300 mg by mouth 3 (three) times daily.    . hydrochlorothiazide (HYDRODIURIL) 12.5 MG tablet Take 6.25 mg by mouth daily. Reported on 09/27/2015    . ipratropium-albuterol (DUONEB) 0.5-2.5 (3) MG/3ML SOLN Take 3 mLs by nebulization every 4 (four) hours as needed (for shortness of breath).     . levETIRAcetam (KEPPRA) 500 MG tablet Take 500 mg by mouth 2 (two) times daily.    . nicotine (NICODERM CQ - DOSED IN MG/24 HOURS) 21 mg/24hr patch Place 21 mg onto the skin daily. Reported on 09/27/2015    . oxyCODONE (ROXICODONE) 5 MG immediate release tablet Limit 1 tab by mouth per day or 2-3 times per day if tolerated 90 tablet 0  . simvastatin (ZOCOR) 40 MG tablet Take 40 mg by mouth at bedtime.     . tiZANidine (ZANAFLEX) 2 MG tablet Take 2 mg by mouth daily.     . traZODone (DESYREL) 150 MG tablet Take 150 mg by mouth at bedtime.     Current Facility-Administered Medications  Medication Dose Route Frequency Provider Last Rate Last Dose  . bupivacaine (PF) (MARCAINE) 0.25 % injection 30 mL  30 mL Other Once Gregory Crisp, MD      . bupivacaine (PF) (MARCAINE) 0.25 % injection 30 mL  30 mL Other Once Gregory Crisp, MD      . fentaNYL (SUBLIMAZE) injection 100 mcg  100 mcg Intravenous Once Gregory Crisp, MD      . fentaNYL (SUBLIMAZE) injection 100 mcg  100 mcg Intravenous Once Gregory Crisp, MD      . lactated ringers infusion 1,000 mL  1,000 mL Intravenous Continuous Gregory Crisp, MD      . lactated ringers infusion 1,000 mL  1,000 mL Intravenous Continuous Gregory Crisp, MD      . lactated ringers infusion 1,000 mL  1,000 mL Intravenous Continuous Gregory Crisp, MD      . lactated ringers infusion 1,000 mL  1,000 mL Intravenous Continuous Gregory Crisp, MD      . lactated ringers  infusion 1,000 mL  1,000 mL Intravenous Continuous Gregory Crisp, MD      . lactated ringers infusion 1,000 mL  1,000 mL Intravenous Continuous Gregory Crisp, MD 125 mL/hr at 10/29/15 0810 1,000 mL at 10/29/15 0810  . lidocaine (PF) (XYLOCAINE) 1 % injection 10 mL  10 mL Subcutaneous Once Gregory Crisp, MD      . lidocaine (PF) (XYLOCAINE) 1 % injection 10 mL  10 mL Subcutaneous Once Gregory Crisp, MD      . midazolam (VERSED) 5 MG/5ML injection 5 mg  5 mg Intravenous Once Gregory Crisp, MD      . midazolam (VERSED) 5 MG/5ML injection 5 mg  5 mg   Intravenous Once Gregory Crisp, MD      . orphenadrine (NORFLEX) injection 60 mg  60 mg Intramuscular Once Gregory Crisp, MD      . orphenadrine (NORFLEX) injection 60 mg  60 mg Intramuscular Once Gregory Crisp, MD      . orphenadrine (NORFLEX) injection 60 mg  60 mg Intramuscular Once Gregory Crisp, MD      . orphenadrine (NORFLEX) injection 60 mg  60 mg Intramuscular Once Gregory Crisp, MD      . sodium chloride 0.9 % injection 20 mL  20 mL Other Once Gregory Crisp, MD      . triamcinolone acetonide (KENALOG-40) injection 40 mg  40 mg Other Once Gregory Crisp, MD      . triamcinolone acetonide (KENALOG-40) injection 40 mg  40 mg Other Once Gregory Crisp, MD        OBJECTIVE: There were no vitals filed for this visit.   There is no height or weight on file to calculate BMI.    ECOG FS:0 - Asymptomatic  General: Well-developed, well-nourished, no acute distress. Eyes: Pink conjunctiva, anicteric sclera. HEENT: Normocephalic, moist mucous membranes, clear oropharnyx. Lungs: Clear to auscultation bilaterally. Heart: Regular rate and rhythm. No rubs, murmurs, or gallops. Abdomen: Soft, nontender, nondistended. No organomegaly noted, normoactive bowel sounds. Musculoskeletal: No edema, cyanosis, or clubbing. Neuro: Alert, answering all questions appropriately. Cranial nerves grossly intact. Skin: No rashes or petechiae noted. Psych: Normal  affect. Lymphatics: No cervical, calvicular, axillary or inguinal LAD.   LAB RESULTS:  Lab Results  Component Value Date   NA 138 06/09/2015   K 3.6 06/09/2015   CL 106 06/09/2015   CO2 24 06/09/2015   GLUCOSE 116 (H) 06/09/2015   BUN 6 06/09/2015   CREATININE 0.81 06/09/2015   CALCIUM 9.3 06/09/2015   PROT 7.3 06/09/2015   ALBUMIN 4.0 06/09/2015   AST 20 06/09/2015   ALT 14 06/09/2015   ALKPHOS 85 06/09/2015   BILITOT 0.6 06/09/2015   GFRNONAA >60 06/09/2015   GFRAA >60 06/09/2015    Lab Results  Component Value Date   WBC 12.0 (H) 12/11/2015   NEUTROABS 6.5 01/26/2014   HGB 16.4 (H) 12/11/2015   HCT 48.4 (H) 12/11/2015   MCV 91.7 12/11/2015   PLT 279 12/11/2015     STUDIES: No results found.  ASSESSMENT: Leukocytosis, unspecified; polycythemia, secondary.   PLAN:    1. Leukocytosis, unspecified:  Patient's white blood cell count is mildly elevated today, but essentially unchanged for greater than 18 months. No intervention is needed at this time. Peripheral blood flow cytometry was sent for completeness. Patient does not require bone marrow biopsy at this time. Return to clinic in 3 months with repeat laboratory work and further evaluation. 2. Polycythemia, secondary: Unclear etiology, but possibly secondary to heavy tobacco use. Patient reports she was receiving phlebotomy in the past, but we do not have documentation of this. Her hemoglobin is mildly elevated today and she does not require phlebotomy. JAK-2 mutation, iron stores, and erythropoietin levels are pending at time of dictation. 3. Hypertension: Patient's blood pressure is mildly elevated today, monitor.  Patient expressed understanding and was in agreement with this plan. She also understands that She can call clinic at any time with any questions, concerns, or complaints.    Timothy J Finnegan, MD   04/06/2016 11:21 PM     

## 2016-04-07 ENCOUNTER — Inpatient Hospital Stay: Payer: Medicare Other | Admitting: Oncology

## 2016-04-07 ENCOUNTER — Inpatient Hospital Stay: Payer: Medicare Other

## 2016-04-18 ENCOUNTER — Ambulatory Visit: Payer: Medicare Other | Attending: Family Medicine

## 2016-04-22 NOTE — Progress Notes (Signed)
[place]  [phone] [phone]) [phone]  ID: Sherri Foster OB: 1960/07/04  MR#: [id number]  BUL#:[id number]  Patient Care Team: Sharyne Peach, MD as PCP - General (Family Medicine)  CHIEF COMPLAINT: Leukocytosis, unspecified; polycythemia, secondary  INTERVAL HISTORY: Patient returns to clinic today for repeat laboratory work and further evaluation. She said that she continues to smoke, but has cut back significantly. Currently she feels well and is at her baseline. She is no neurologic complaints. She denies any recent fevers or illnesses. She has a good appetite and denies weight loss. She denies any chest pain or shortness of breath. She has no nausea, vomiting, constipation, or diarrhea. She has no urinary complaints. Patient offers no specific complaints today.  REVIEW OF SYSTEMS:   Review of Systems  Constitutional: Negative.  Negative for fever, malaise/fatigue and weight loss.  Respiratory: Negative.  Negative for cough and shortness of breath.   Cardiovascular: Negative.  Negative for chest pain and leg swelling.  Gastrointestinal: Negative.  Negative for abdominal pain.  Genitourinary: Negative.   Musculoskeletal: Negative.   Neurological: Negative.  Negative for weakness.  Psychiatric/Behavioral: Negative.     As per HPI. Otherwise, a complete review of systems is negative.  PAST MEDICAL HISTORY: Past Medical History:  Diagnosis Date  . Allergy   . Arthritis   . Back pain   . Bipolar 1 disorder ([place])   . Blood transfusion without reported diagnosis   . COPD (chronic obstructive pulmonary disease) ([place])   . Depression   . Hepatitis C   . Hyperlipidemia   . Hypertension   . Opiate use    long term use of opiate analgesics.  . Seizures ([place])     PAST SURGICAL HISTORY: Past Surgical History:  Procedure Laterality Date  . ABDOMINAL HYSTERECTOMY      FAMILY HISTORY: Family History  Problem Relation Age of Onset  .  Arthritis Mother   . Diabetes Mother   . Hyperlipidemia Mother   . Hypertension Mother   . Miscarriages / [place] Mother     ADVANCED DIRECTIVES (Y/N):  N  HEALTH MAINTENANCE: Social History  Substance Use Topics  . Smoking status: Current Every Day Smoker    Packs/day: 0.30    Years: 40.00    Types: Cigarettes  . Smokeless tobacco: Never Used     Comment: 3 na day  . Alcohol use No     Colonoscopy:  PAP:  Bone density:  Lipid panel:  Allergies  Allergen Reactions  . Amitriptyline Anaphylaxis  . Other Shortness Of Breath and Itching    ragweed  . Potassium Chloride Shortness Of Breath  . Potassium-Containing Compounds Shortness Of Breath  . Sulfa Antibiotics Other (See Comments)  . Topical Sulfur Other (See Comments)    Reaction:  Unknown   . Aspirin Rash and Other (See Comments)    Breaks mouth out Other reaction(s): OTHER all over my body  . Codeine Rash    codeine with tylenol  . Latex Rash    Other reaction(s): UNKNOWN Other reaction(s): UNKNOWN  . Topiramate Nausea And Vomiting  . Tylenol [Acetaminophen] Rash    Current Outpatient Prescriptions  Medication Sig Dispense Refill  . albuterol (PROVENTIL HFA;VENTOLIN HFA) 108 (90 BASE) MCG/ACT inhaler Inhale 2 puffs into the lungs every 6 (six) hours as needed for wheezing or shortness of breath.    . cetirizine (ZYRTEC) 10 MG tablet Take 10 mg by mouth daily.    . diclofenac sodium (VOLTAREN) 1 %  GEL Apply 2-4 g to painful area of the knee 4 times per day if tolerated. Patient with strain, sprain, and spasms of the knee 500 g 0  . EPINEPHrine 0.3 mg/0.3 mL IJ SOAJ injection Inject 0.3 mg into the muscle once as needed (for severe allergic reaction).     Marland Kitchen FLUoxetine (PROZAC) 20 MG capsule Take 20 mg by mouth daily.    . fluticasone (FLONASE) 50 MCG/ACT nasal spray Place 2 sprays into both nostrils daily.    . Fluticasone-Salmeterol (ADVAIR) 250-50 MCG/DOSE AEPB Inhale 1 puff into the lungs every 12  (twelve) hours.    . gabapentin (NEURONTIN) 300 MG capsule Take 300 mg by mouth 3 (three) times daily.    . hydrochlorothiazide (HYDRODIURIL) 12.5 MG tablet Take 6.25 mg by mouth daily. Reported on 09/27/2015    . ipratropium-albuterol (DUONEB) 0.5-2.5 (3) MG/3ML SOLN Take 3 mLs by nebulization every 4 (four) hours as needed (for shortness of breath).     . levETIRAcetam (KEPPRA) 500 MG tablet Take 500 mg by mouth 2 (two) times daily.    Marland Kitchen oxyCODONE (ROXICODONE) 5 MG immediate release tablet Limit 1 tab by mouth per day or 2-3 times per day if tolerated 90 tablet 0  . simvastatin (ZOCOR) 40 MG tablet Take 40 mg by mouth at bedtime.     Marland Kitchen tiZANidine (ZANAFLEX) 2 MG tablet Take 2 mg by mouth daily.     . traZODone (DESYREL) 150 MG tablet Take 150 mg by mouth at bedtime.    . nicotine (NICODERM CQ - DOSED IN MG/24 HOURS) 21 mg/24hr patch Place 21 mg onto the skin daily. Reported on 09/27/2015     Current Facility-Administered Medications  Medication Dose Route Frequency Provider Last Rate Last Dose  . bupivacaine (PF) (MARCAINE) 0.25 % injection 30 mL  30 mL Other Once Mohammed Kindle, MD      . bupivacaine (PF) (MARCAINE) 0.25 % injection 30 mL  30 mL Other Once Mohammed Kindle, MD      . fentaNYL (SUBLIMAZE) injection 100 mcg  100 mcg Intravenous Once Mohammed Kindle, MD      . fentaNYL (SUBLIMAZE) injection 100 mcg  100 mcg Intravenous Once Mohammed Kindle, MD      . lactated ringers infusion 1,000 mL  1,000 mL Intravenous Continuous Mohammed Kindle, MD      . lactated ringers infusion 1,000 mL  1,000 mL Intravenous Continuous Mohammed Kindle, MD      . lactated ringers infusion 1,000 mL  1,000 mL Intravenous Continuous Mohammed Kindle, MD      . lactated ringers infusion 1,000 mL  1,000 mL Intravenous Continuous Mohammed Kindle, MD      . lactated ringers infusion 1,000 mL  1,000 mL Intravenous Continuous Mohammed Kindle, MD      . lactated ringers infusion 1,000 mL  1,000 mL Intravenous Continuous Mohammed Kindle,  MD 125 mL/hr at 10/29/15 0810 1,000 mL at 10/29/15 0810  . lidocaine (PF) (XYLOCAINE) 1 % injection 10 mL  10 mL Subcutaneous Once Mohammed Kindle, MD      . lidocaine (PF) (XYLOCAINE) 1 % injection 10 mL  10 mL Subcutaneous Once Mohammed Kindle, MD      . midazolam (VERSED) 5 MG/5ML injection 5 mg  5 mg Intravenous Once Mohammed Kindle, MD      . midazolam (VERSED) 5 MG/5ML injection 5 mg  5 mg Intravenous Once Mohammed Kindle, MD      . orphenadrine (NORFLEX) injection 60 mg  60 mg Intramuscular  Once Mohammed Kindle, MD      . orphenadrine (NORFLEX) injection 60 mg  60 mg Intramuscular Once Mohammed Kindle, MD      . orphenadrine (NORFLEX) injection 60 mg  60 mg Intramuscular Once Mohammed Kindle, MD      . orphenadrine (NORFLEX) injection 60 mg  60 mg Intramuscular Once Mohammed Kindle, MD      . sodium chloride 0.9 % injection 20 mL  20 mL Other Once Mohammed Kindle, MD      . triamcinolone acetonide (KENALOG-40) injection 40 mg  40 mg Other Once Mohammed Kindle, MD      . triamcinolone acetonide [facility]) injection 40 mg  40 mg Other Once Mohammed Kindle, MD        OBJECTIVE: Vitals:   04/23/16 1104  BP: 133/86  Pulse: 77  Resp: 18  Temp: 98.2 F (36.8 C)     Body mass index is 28.78 kg/m.    ECOG FS:0 - Asymptomatic  General: Well-developed, well-nourished, no acute distress. Eyes: Pink conjunctiva, anicteric sclera. Lungs: Clear to auscultation bilaterally. Heart: Regular rate and rhythm. No rubs, murmurs, or gallops. Abdomen: Soft, nontender, nondistended. No organomegaly noted, normoactive bowel sounds. Musculoskeletal: No edema, cyanosis, or clubbing. Neuro: Alert, answering all questions appropriately. Cranial nerves grossly intact. Skin: No rashes or petechiae noted. Psych: Normal affect.   LAB RESULTS:  Lab Results  Component Value Date   NA 138 06/09/2015   K 3.6 06/09/2015   CL 106 06/09/2015   CO2 24 06/09/2015   GLUCOSE 116 (H) 06/09/2015   BUN 6 06/09/2015   CREATININE  0.81 06/09/2015   CALCIUM 9.3 06/09/2015   PROT 7.3 06/09/2015   ALBUMIN 4.0 06/09/2015   AST 20 06/09/2015   ALT 14 06/09/2015   ALKPHOS 85 06/09/2015   BILITOT 0.6 06/09/2015   GFRNONAA >60 06/09/2015   GFRAA >60 06/09/2015    Lab Results  Component Value Date   WBC 7.7 04/23/2016   NEUTROABS 3.4 04/23/2016   HGB 15.4 04/23/2016   HCT 44.9 04/23/2016   MCV 92.3 04/23/2016   PLT 251 04/23/2016     STUDIES: No results found.  ASSESSMENT: Leukocytosis, unspecified; polycythemia, secondary.   PLAN:    1. Leukocytosis, unspecified:  Patient's white blood cell count is now within normal limits. Previously, flow cytometry was reported as negative. Her elevation may have possibly related to tobacco use which she has now decreased. Patient does not require bone marrow biopsy at this time. Return to clinic in 6 months with repeat laboratory work and further evaluation. If everything remains stable, she likely can be discharged from clinic. 2. Polycythemia, secondary: Unclear etiology, but possibly secondary to heavy tobacco use. Patient reports she was receiving phlebotomy in the past, but we do not have documentation of this. Her hemoglobin has improved since decreasing her tobacco use.  JAK-2 mutation, iron stores, and erythropoietin levels are all negative or within normal limits.  3. Hypertension: Patient's blood pressure is within normal limits today, monitor.  Patient expressed understanding and was in agreement with this plan. She also understands that She can call clinic at any time with any questions, concerns, or complaints.    Lloyd Huger, MD   04/23/2016 11:10 AM

## 2016-04-23 ENCOUNTER — Inpatient Hospital Stay: Payer: Medicare Other | Attending: Oncology | Admitting: Oncology

## 2016-04-23 ENCOUNTER — Inpatient Hospital Stay: Payer: Medicare Other

## 2016-04-23 VITALS — BP 133/86 | HR 77 | Temp 98.2°F | Resp 18 | Wt 147.4 lb

## 2016-04-23 DIAGNOSIS — E785 Hyperlipidemia, unspecified: Secondary | ICD-10-CM | POA: Diagnosis not present

## 2016-04-23 DIAGNOSIS — I10 Essential (primary) hypertension: Secondary | ICD-10-CM | POA: Diagnosis not present

## 2016-04-23 DIAGNOSIS — J449 Chronic obstructive pulmonary disease, unspecified: Secondary | ICD-10-CM | POA: Diagnosis not present

## 2016-04-23 DIAGNOSIS — M1990 Unspecified osteoarthritis, unspecified site: Secondary | ICD-10-CM | POA: Insufficient documentation

## 2016-04-23 DIAGNOSIS — D72829 Elevated white blood cell count, unspecified: Secondary | ICD-10-CM

## 2016-04-23 DIAGNOSIS — D751 Secondary polycythemia: Secondary | ICD-10-CM | POA: Diagnosis not present

## 2016-04-23 DIAGNOSIS — B1920 Unspecified viral hepatitis C without hepatic coma: Secondary | ICD-10-CM | POA: Insufficient documentation

## 2016-04-23 DIAGNOSIS — F17210 Nicotine dependence, cigarettes, uncomplicated: Secondary | ICD-10-CM | POA: Diagnosis not present

## 2016-04-23 DIAGNOSIS — Z79899 Other long term (current) drug therapy: Secondary | ICD-10-CM | POA: Diagnosis not present

## 2016-04-23 LAB — CBC WITH DIFFERENTIAL/PLATELET
BASOS PCT: 1 %
Basophils Absolute: 0.1 10*3/uL (ref 0–0.1)
EOS ABS: 0.1 10*3/uL (ref 0–0.7)
Eosinophils Relative: 2 %
HCT: 44.9 % (ref 35.0–47.0)
HEMOGLOBIN: 15.4 g/dL (ref 12.0–16.0)
Lymphocytes Relative: 42 %
Lymphs Abs: 3.2 10*3/uL (ref 1.0–3.6)
MCH: 31.7 pg (ref 26.0–34.0)
MCHC: 34.4 g/dL (ref 32.0–36.0)
MCV: 92.3 fL (ref 80.0–100.0)
Monocytes Absolute: 0.9 10*3/uL (ref 0.2–0.9)
Monocytes Relative: 11 %
NEUTROS PCT: 44 %
Neutro Abs: 3.4 10*3/uL (ref 1.4–6.5)
Platelets: 251 10*3/uL (ref 150–440)
RBC: 4.86 MIL/uL (ref 3.80–5.20)
RDW: 12.8 % (ref 11.5–14.5)
WBC: 7.7 10*3/uL (ref 3.6–11.0)

## 2016-04-30 ENCOUNTER — Ambulatory Visit: Admission: RE | Admit: 2016-04-30 | Payer: Medicare Other | Source: Ambulatory Visit

## 2016-05-08 ENCOUNTER — Ambulatory Visit
Admission: RE | Admit: 2016-05-08 | Discharge: 2016-05-08 | Disposition: A | Discharge status: Home / Self Care | Payer: Medicare Other | Source: Ambulatory Visit | Attending: Family Medicine | Admitting: Family Medicine

## 2016-05-08 DIAGNOSIS — Z1231 Encounter for screening mammogram for malignant neoplasm of breast: Secondary | ICD-10-CM | POA: Insufficient documentation

## 2016-10-17 ENCOUNTER — Other Ambulatory Visit: Payer: Self-pay

## 2016-10-17 DIAGNOSIS — D751 Secondary polycythemia: Secondary | ICD-10-CM

## 2016-10-20 NOTE — Progress Notes (Deleted)
[facility]  [phone] [phone]  ID: Orlie Dakin OB: July 01, 1960  MR#: [id number]  NIO#:[id number]  Patient Care Team: Sharyne Peach, MD as PCP - General (Family Medicine)  CHIEF COMPLAINT: Leukocytosis, unspecified; polycythemia, secondary  INTERVAL HISTORY: Patient returns to clinic today for repeat laboratory work and further evaluation. She said that she continues to smoke, but has cut back significantly. Currently she feels well and is at her baseline. She is no neurologic complaints. She denies any recent fevers or illnesses. She has a good appetite and denies weight loss. She denies any chest pain or shortness of breath. She has no nausea, vomiting, constipation, or diarrhea. She has no urinary complaints. Patient offers no specific complaints today.  REVIEW OF SYSTEMS:   Review of Systems  Constitutional: Negative.  Negative for fever, malaise/fatigue and weight loss.  Respiratory: Negative.  Negative for cough and shortness of breath.   Cardiovascular: Negative.  Negative for chest pain and leg swelling.  Gastrointestinal: Negative.  Negative for abdominal pain.  Genitourinary: Negative.   Musculoskeletal: Negative.   Neurological: Negative.  Negative for weakness.  Psychiatric/Behavioral: Negative.     As per HPI. Otherwise, a complete review of systems is negative.  PAST MEDICAL HISTORY: Past Medical History:  Diagnosis Date  . Allergy   . Arthritis   . Back pain   . Bipolar 1 disorder ([place])   . Blood transfusion without reported diagnosis   . COPD (chronic obstructive pulmonary disease) ([place])   . Depression   . Hepatitis C   . Hyperlipidemia   . Hypertension   . Opiate use    long term use of opiate analgesics.  . Seizures ([place])     PAST SURGICAL HISTORY: Past Surgical History:  Procedure Laterality Date  . ABDOMINAL HYSTERECTOMY      FAMILY HISTORY: Family History  Problem Relation Age of Onset  .  Arthritis Mother   . Diabetes Mother   . Hyperlipidemia Mother   . Hypertension Mother   . Miscarriages / [place] Mother   . Breast cancer Neg Hx     ADVANCED DIRECTIVES (Y/N):  N  HEALTH MAINTENANCE: Social History  Substance Use Topics  . Smoking status: Current Every Day Smoker    Packs/day: 0.30    Years: 40.00    Types: Cigarettes  . Smokeless tobacco: Never Used     Comment: 3 na day  . Alcohol use No     Colonoscopy:  PAP:  Bone density:  Lipid panel:  Allergies  Allergen Reactions  . Amitriptyline Anaphylaxis  . Other Shortness Of Breath and Itching    ragweed  . Potassium Chloride Shortness Of Breath  . Potassium-Containing Compounds Shortness Of Breath  . Sulfa Antibiotics Other (See Comments)  . Topical Sulfur Other (See Comments)    Reaction:  Unknown   . Aspirin Rash and Other (See Comments)    Breaks mouth out Other reaction(s): OTHER all over my body  . Codeine Rash    codeine with tylenol  . Latex Rash    Other reaction(s): UNKNOWN Other reaction(s): UNKNOWN  . Topiramate Nausea And Vomiting  . Tylenol [Acetaminophen] Rash    Current Outpatient Prescriptions  Medication Sig Dispense Refill  . albuterol (PROVENTIL HFA;VENTOLIN HFA) 108 (90 BASE) MCG/ACT inhaler Inhale 2 puffs into the lungs every 6 (six) hours as needed for wheezing or shortness of breath.    . cetirizine (ZYRTEC) 10 MG tablet Take 10 mg by mouth daily.    Marland Kitchen  diclofenac sodium (VOLTAREN) 1 % GEL Apply 2-4 g to painful area of the knee 4 times per day if tolerated. Patient with strain, sprain, and spasms of the knee 500 g 0  . EPINEPHrine 0.3 mg/0.3 mL IJ SOAJ injection Inject 0.3 mg into the muscle once as needed (for severe allergic reaction).     Marland Kitchen FLUoxetine (PROZAC) 20 MG capsule Take 20 mg by mouth daily.    . fluticasone (FLONASE) 50 MCG/ACT nasal spray Place 2 sprays into both nostrils daily.    . Fluticasone-Salmeterol (ADVAIR) 250-50 MCG/DOSE AEPB Inhale 1 puff into  the lungs every 12 (twelve) hours.    . gabapentin (NEURONTIN) 300 MG capsule Take 300 mg by mouth 3 (three) times daily.    . hydrochlorothiazide (HYDRODIURIL) 12.5 MG tablet Take 6.25 mg by mouth daily. Reported on 09/27/2015    . ipratropium-albuterol (DUONEB) 0.5-2.5 (3) MG/3ML SOLN Take 3 mLs by nebulization every 4 (four) hours as needed (for shortness of breath).     . levETIRAcetam (KEPPRA) 500 MG tablet Take 500 mg by mouth 2 (two) times daily.    . nicotine (NICODERM CQ - DOSED IN MG/24 HOURS) 21 mg/24hr patch Place 21 mg onto the skin daily. Reported on 09/27/2015    . oxyCODONE (ROXICODONE) 5 MG immediate release tablet Limit 1 tab by mouth per day or 2-3 times per day if tolerated 90 tablet 0  . simvastatin (ZOCOR) 40 MG tablet Take 40 mg by mouth at bedtime.     Marland Kitchen tiZANidine (ZANAFLEX) 2 MG tablet Take 2 mg by mouth daily.     . traZODone (DESYREL) 150 MG tablet Take 150 mg by mouth at bedtime.     Current Facility-Administered Medications  Medication Dose Route Frequency Provider Last Rate Last Dose  . bupivacaine (PF) (MARCAINE) 0.25 % injection 30 mL  30 mL Other Once Mohammed Kindle, MD      . bupivacaine (PF) (MARCAINE) 0.25 % injection 30 mL  30 mL Other Once Mohammed Kindle, MD      . fentaNYL (SUBLIMAZE) injection 100 mcg  100 mcg Intravenous Once Mohammed Kindle, MD      . fentaNYL (SUBLIMAZE) injection 100 mcg  100 mcg Intravenous Once Mohammed Kindle, MD      . lactated ringers infusion 1,000 mL  1,000 mL Intravenous Continuous Mohammed Kindle, MD      . lactated ringers infusion 1,000 mL  1,000 mL Intravenous Continuous Mohammed Kindle, MD      . lactated ringers infusion 1,000 mL  1,000 mL Intravenous Continuous Mohammed Kindle, MD      . lactated ringers infusion 1,000 mL  1,000 mL Intravenous Continuous Mohammed Kindle, MD      . lactated ringers infusion 1,000 mL  1,000 mL Intravenous Continuous Mohammed Kindle, MD      . lactated ringers infusion 1,000 mL  1,000 mL  Intravenous Continuous Mohammed Kindle, MD 125 mL/hr at 10/29/15 0810 1,000 mL at 10/29/15 0810  . lidocaine (PF) (XYLOCAINE) 1 % injection 10 mL  10 mL Subcutaneous Once Mohammed Kindle, MD      . lidocaine (PF) (XYLOCAINE) 1 % injection 10 mL  10 mL Subcutaneous Once Mohammed Kindle, MD      . midazolam (VERSED) 5 MG/5ML injection 5 mg  5 mg Intravenous Once Mohammed Kindle, MD      . midazolam (VERSED) 5 MG/5ML injection 5 mg  5 mg Intravenous Once Mohammed Kindle, MD      . orphenadrine (NORFLEX) injection 60  mg  60 mg Intramuscular Once Mohammed Kindle, MD      . orphenadrine (NORFLEX) injection 60 mg  60 mg Intramuscular Once Mohammed Kindle, MD      . orphenadrine (NORFLEX) injection 60 mg  60 mg Intramuscular Once Mohammed Kindle, MD      . orphenadrine (NORFLEX) injection 60 mg  60 mg Intramuscular Once Mohammed Kindle, MD      . sodium chloride 0.9 % injection 20 mL  20 mL Other Once Mohammed Kindle, MD      . triamcinolone acetonide (KENALOG-40) injection 40 mg  40 mg Other Once Mohammed Kindle, MD      . triamcinolone acetonide (KENALOG-40) injection 40 mg  40 mg Other Once Mohammed Kindle, MD        OBJECTIVE: There were no vitals filed for this visit.   There is no height or weight on file to calculate BMI.    ECOG FS:0 - Asymptomatic  General: Well-developed, well-nourished, no acute distress. Eyes: Pink conjunctiva, anicteric sclera. Lungs: Clear to auscultation bilaterally. Heart: Regular rate and rhythm. No rubs, murmurs, or gallops. Abdomen: Soft, nontender, nondistended. No organomegaly noted, normoactive bowel sounds. Musculoskeletal: No edema, cyanosis, or clubbing. Neuro: Alert, answering all questions appropriately. Cranial nerves grossly intact. Skin: No rashes or petechiae noted. Psych: Normal affect.   LAB RESULTS:  Lab Results  Component Value Date   NA 138 06/09/2015   K 3.6 06/09/2015   CL 106 06/09/2015   CO2 24 06/09/2015   GLUCOSE 116 (H) 06/09/2015   BUN 6  06/09/2015   CREATININE 0.81 06/09/2015   CALCIUM 9.3 06/09/2015   PROT 7.3 06/09/2015   ALBUMIN 4.0 06/09/2015   AST 20 06/09/2015   ALT 14 06/09/2015   ALKPHOS 85 06/09/2015   BILITOT 0.6 06/09/2015   GFRNONAA >60 06/09/2015   GFRAA >60 06/09/2015    Lab Results  Component Value Date   WBC 7.7 04/23/2016   NEUTROABS 3.4 04/23/2016   HGB 15.4 04/23/2016   HCT 44.9 04/23/2016   MCV 92.3 04/23/2016   PLT 251 04/23/2016     STUDIES: No results found.  ASSESSMENT: Leukocytosis, unspecified; polycythemia, secondary.   PLAN:    1. Leukocytosis, unspecified:  Patient's white blood cell count is now within normal limits. Previously, flow cytometry was reported as negative. Her elevation may have possibly related to tobacco use which she has now decreased. Patient does not require bone marrow biopsy at this time. Return to clinic in 6 months with repeat laboratory work and further evaluation. If everything remains stable, she likely can be discharged from clinic. 2. Polycythemia, secondary: Unclear etiology, but possibly secondary to heavy tobacco use. Patient reports she was receiving phlebotomy in the past, but we do not have documentation of this. Her hemoglobin has improved since decreasing her tobacco use.  JAK-2 mutation, iron stores, and erythropoietin levels are all negative or within normal limits.  3. Hypertension: Patient's blood pressure is within normal limits today, monitor.  Patient expressed understanding and was in agreement with this plan. She also understands that She can call clinic at any time with any questions, concerns, or complaints.    Lloyd Huger, MD   10/20/2016 11:10 PM

## 2016-10-21 ENCOUNTER — Inpatient Hospital Stay: Payer: Medicare Other

## 2016-10-21 ENCOUNTER — Inpatient Hospital Stay: Payer: Medicare Other | Admitting: Oncology

## 2016-11-25 ENCOUNTER — Inpatient Hospital Stay: Payer: Medicare Other

## 2016-11-25 ENCOUNTER — Inpatient Hospital Stay: Payer: Medicare Other | Admitting: Oncology

## 2017-04-01 ENCOUNTER — Other Ambulatory Visit: Payer: Self-pay | Admitting: Family Medicine

## 2017-04-01 DIAGNOSIS — Z1231 Encounter for screening mammogram for malignant neoplasm of breast: Secondary | ICD-10-CM

## 2017-05-05 ENCOUNTER — Ambulatory Visit
Admission: RE | Admit: 2017-05-05 | Discharge: 2017-05-05 | Disposition: A | Discharge status: Home / Self Care | Payer: Medicare Other | Source: Ambulatory Visit | Attending: Family Medicine | Admitting: Family Medicine

## 2017-05-05 DIAGNOSIS — Z1231 Encounter for screening mammogram for malignant neoplasm of breast: Secondary | ICD-10-CM | POA: Insufficient documentation

## 2017-05-06 ENCOUNTER — Other Ambulatory Visit: Payer: Self-pay | Admitting: Oncology

## 2017-05-06 DIAGNOSIS — D751 Secondary polycythemia: Secondary | ICD-10-CM

## 2017-05-17 NOTE — Progress Notes (Signed)
[facility]  [phone] [phone]  ID: Orlie Dakin OB: 1960/04/25  MR#: [id number]  FTD#:[id number]  Patient Care Team: Sharyne Peach, MD as PCP - General (Family Medicine)  CHIEF COMPLAINT: Leukocytosis, unspecified; polycythemia, secondary  INTERVAL HISTORY: Patient was last evaluated in clinic greater than 1 year ago.  She returns today for repeat laboratory work and further evaluation. She said that she continues to smoke, but has cut back significantly. Currently she feels well and is asymptomatic. She is no neurologic complaints. She denies any recent fevers or illnesses. She has a good appetite and denies weight loss. She denies any chest pain or shortness of breath. She has no nausea, vomiting, constipation, or diarrhea.  She denies any melena or hematochezia.  She has no urinary complaints. Patient offers no specific complaints today.  REVIEW OF SYSTEMS:   Review of Systems  Constitutional: Negative.  Negative for fever, malaise/fatigue and weight loss.  Respiratory: Negative.  Negative for cough and shortness of breath.   Cardiovascular: Negative.  Negative for chest pain and leg swelling.  Gastrointestinal: Negative.  Negative for abdominal pain, blood in stool and melena.  Genitourinary: Negative.  Negative for dysuria and hematuria.  Musculoskeletal: Negative.   Skin: Negative.  Negative for rash.  Neurological: Negative.  Negative for sensory change, focal weakness and weakness.  Psychiatric/Behavioral: Negative.  The patient is not nervous/anxious.     As per HPI. Otherwise, a complete review of systems is negative.  PAST MEDICAL HISTORY: Past Medical History:  Diagnosis Date  . Allergy   . Arthritis   . Back pain   . Bipolar 1 disorder ([place])   . Blood transfusion without reported diagnosis   . COPD (chronic obstructive pulmonary disease) ([place])   . Depression   . Hepatitis C   . Hyperlipidemia   . Hypertension    . Opiate use    long term use of opiate analgesics.  . Seizures ([place])     PAST SURGICAL HISTORY: Past Surgical History:  Procedure Laterality Date  . ABDOMINAL HYSTERECTOMY      FAMILY HISTORY: Family History  Problem Relation Age of Onset  . Arthritis Mother   . Diabetes Mother   . Hyperlipidemia Mother   . Hypertension Mother   . Miscarriages / [place] Mother   . Breast cancer Neg Hx     ADVANCED DIRECTIVES (Y/N):  N  HEALTH MAINTENANCE: Social History   Tobacco Use  . Smoking status: Current Every Day Smoker    Packs/day: 0.30    Years: 40.00    Pack years: 12.00    Types: Cigarettes  . Smokeless tobacco: Never Used  . Tobacco comment: 3 na day  Substance Use Topics  . Alcohol use: No    Alcohol/week: 0.0 oz  . Drug use: No     Colonoscopy:  PAP:  Bone density:  Lipid panel:  Allergies  Allergen Reactions  . Amitriptyline Anaphylaxis  . Other Shortness Of Breath and Itching    ragweed  . Potassium Chloride Shortness Of Breath  . Potassium-Containing Compounds Shortness Of Breath  . Sulfa Antibiotics Other (See Comments)  . Topical Sulfur Other (See Comments)    Reaction:  Unknown   . Aspirin Rash and Other (See Comments)    Breaks mouth out Other reaction(s): OTHER all over my body  . Codeine Rash    codeine with tylenol  . Latex Rash    Other reaction(s): UNKNOWN Other reaction(s): UNKNOWN  . Topiramate Nausea  And Vomiting  . Tylenol [Acetaminophen] Rash    Current Outpatient Medications  Medication Sig Dispense Refill  . albuterol (PROVENTIL HFA;VENTOLIN HFA) 108 (90 BASE) MCG/ACT inhaler Inhale 2 puffs into the lungs every 6 (six) hours as needed for wheezing or shortness of breath.    . cetirizine (ZYRTEC) 10 MG tablet Take 10 mg by mouth daily.    . diclofenac sodium (VOLTAREN) 1 % GEL Apply 2-4 g to painful area of the knee 4 times per day if tolerated. Patient with strain, sprain, and spasms of the knee 500 g 0  . EPINEPHrine  0.3 mg/0.3 mL IJ SOAJ injection Inject 0.3 mg into the muscle once as needed (for severe allergic reaction).     Marland Kitchen FLUoxetine (PROZAC) 20 MG capsule Take 20 mg by mouth daily.    . fluticasone (FLONASE) 50 MCG/ACT nasal spray Place 2 sprays into both nostrils daily.    . Fluticasone-Salmeterol (ADVAIR) 250-50 MCG/DOSE AEPB Inhale 1 puff into the lungs every 12 (twelve) hours.    . gabapentin (NEURONTIN) 300 MG capsule Take 300 mg by mouth 3 (three) times daily.    . hydrochlorothiazide (HYDRODIURIL) 12.5 MG tablet Take 6.25 mg by mouth daily. Reported on 09/27/2015    . ipratropium-albuterol (DUONEB) 0.5-2.5 (3) MG/3ML SOLN Take 3 mLs by nebulization every 4 (four) hours as needed (for shortness of breath).     . levETIRAcetam (KEPPRA) 500 MG tablet Take 500 mg by mouth 2 (two) times daily.    . nicotine (NICODERM CQ - DOSED IN MG/24 HOURS) 21 mg/24hr patch Place 21 mg onto the skin daily. Reported on 09/27/2015    . predniSONE (DELTASONE) 20 MG tablet Take 20 mg by mouth daily with breakfast.    . simvastatin (ZOCOR) 40 MG tablet Take 40 mg by mouth at bedtime.     Marland Kitchen tiZANidine (ZANAFLEX) 2 MG tablet Take 2 mg by mouth daily.     . traZODone (DESYREL) 150 MG tablet Take 150 mg by mouth at bedtime.    Marland Kitchen oxyCODONE (ROXICODONE) 5 MG immediate release tablet Limit 1 tab by mouth per day or 2-3 times per day if tolerated 90 tablet 0   Current Facility-Administered Medications  Medication Dose Route Frequency Provider Last Rate Last Dose  . bupivacaine (PF) (MARCAINE) 0.25 % injection 30 mL  30 mL Other Once Mohammed Kindle, MD      . bupivacaine (PF) (MARCAINE) 0.25 % injection 30 mL  30 mL Other Once Mohammed Kindle, MD      . fentaNYL (SUBLIMAZE) injection 100 mcg  100 mcg Intravenous Once Mohammed Kindle, MD      . fentaNYL (SUBLIMAZE) injection 100 mcg  100 mcg Intravenous Once Mohammed Kindle, MD      . lactated ringers infusion 1,000 mL  1,000 mL Intravenous Continuous Mohammed Kindle, MD      .  lactated ringers infusion 1,000 mL  1,000 mL Intravenous Continuous Mohammed Kindle, MD      . lactated ringers infusion 1,000 mL  1,000 mL Intravenous Continuous Mohammed Kindle, MD      . lactated ringers infusion 1,000 mL  1,000 mL Intravenous Continuous Mohammed Kindle, MD      . lactated ringers infusion 1,000 mL  1,000 mL Intravenous Continuous Mohammed Kindle, MD      . lactated ringers infusion 1,000 mL  1,000 mL Intravenous Continuous Mohammed Kindle, MD 125 mL/hr at 10/29/15 0810 1,000 mL at 10/29/15 0810  . lidocaine (PF) (XYLOCAINE) 1 % injection 10  mL  10 mL Subcutaneous Once Mohammed Kindle, MD      . lidocaine (PF) (XYLOCAINE) 1 % injection 10 mL  10 mL Subcutaneous Once Mohammed Kindle, MD      . midazolam (VERSED) 5 MG/5ML injection 5 mg  5 mg Intravenous Once Mohammed Kindle, MD      . midazolam (VERSED) 5 MG/5ML injection 5 mg  5 mg Intravenous Once Mohammed Kindle, MD      . orphenadrine (NORFLEX) injection 60 mg  60 mg Intramuscular Once Mohammed Kindle, MD      . orphenadrine (NORFLEX) injection 60 mg  60 mg Intramuscular Once Mohammed Kindle, MD      . orphenadrine (NORFLEX) injection 60 mg  60 mg Intramuscular Once Mohammed Kindle, MD      . orphenadrine (NORFLEX) injection 60 mg  60 mg Intramuscular Once Mohammed Kindle, MD      . sodium chloride 0.9 % injection 20 mL  20 mL Other Once Mohammed Kindle, MD      . triamcinolone acetonide (KENALOG-40) injection 40 mg  40 mg Other Once Mohammed Kindle, MD      . triamcinolone acetonide (KENALOG-40) injection 40 mg  40 mg Other Once Mohammed Kindle, MD        OBJECTIVE: Vitals:   05/21/17 1015  BP: (!) 144/89  Pulse: 92  Resp: 18  Temp: 99.1 F (37.3 C)     Body mass index is 29.37 kg/m.    ECOG FS:0 - Asymptomatic  General: Well-developed, well-nourished, no acute distress. Eyes: Pink conjunctiva, anicteric sclera. Lungs: Clear to auscultation bilaterally. Heart: Regular rate and rhythm. No rubs, murmurs, or gallops. Abdomen:  Soft, nontender, nondistended. No organomegaly noted, normoactive bowel sounds. Musculoskeletal: No edema, cyanosis, or clubbing. Neuro: Alert, answering all questions appropriately. Cranial nerves grossly intact. Skin: No rashes or petechiae noted. Psych: Normal affect.   LAB RESULTS:  Lab Results  Component Value Date   NA 138 06/09/2015   K 3.6 06/09/2015   CL 106 06/09/2015   CO2 24 06/09/2015   GLUCOSE 116 (H) 06/09/2015   BUN 6 06/09/2015   CREATININE 0.81 06/09/2015   CALCIUM 9.3 06/09/2015   PROT 7.3 06/09/2015   ALBUMIN 4.0 06/09/2015   AST 20 06/09/2015   ALT 14 06/09/2015   ALKPHOS 85 06/09/2015   BILITOT 0.6 06/09/2015   GFRNONAA >60 06/09/2015   GFRAA >60 06/09/2015    Lab Results  Component Value Date   WBC 18.3 (H) 05/21/2017   NEUTROABS 15.5 (H) 05/21/2017   HGB 15.3 05/21/2017   HCT 44.9 05/21/2017   MCV 92.7 05/21/2017   PLT 309 05/21/2017     STUDIES: Mm Digital Screening Bilateral  Result Date: 05/06/2017 CLINICAL DATA:  Screening. EXAM: DIGITAL SCREENING BILATERAL MAMMOGRAM WITH CAD COMPARISON:  Previous exam(s). ACR Breast Density Category b: There are scattered areas of fibroglandular density. FINDINGS: There are no findings suspicious for malignancy. Images were processed with CAD. IMPRESSION: No mammographic evidence of malignancy. A result letter of this screening mammogram will be mailed directly to the patient. RECOMMENDATION: Screening mammogram in one year. (Code:SM-B-01Y) BI-RADS CATEGORY  1: Negative. Electronically Signed   By: Fidela Salisbury M.D.   On: 05/06/2017 17:01    ASSESSMENT: Leukocytosis, unspecified; polycythemia, secondary.   PLAN:    1. Leukocytosis, unspecified:  Patient's white blood cell count is slightly increased today, but patient's baseline appears to be between 12.0 and 19.3 over the past several years. Previously, all of her other laboratory work  including flow cytometry was reported as negative.  Will get  BCR-ABL at next lab draw for completeness.  Patient does not require bone marrow biopsy at this time. Return to clinic in 3 months with repeat laboratory work and further evaluation. If everything remains stable, she likely can be discharged from clinic. 2. Polycythemia, secondary: Resolved.  Likely secondary to heavy tobacco use which patient is decreasing.  Patient reports she was receiving phlebotomy in the past, but we do not have documentation of this. Her hemoglobin has improved since decreasing her tobacco use.  JAK-2 mutation, iron stores, and erythropoietin levels are all negative or within normal limits.  3. Hypertension: Patient's blood pressure is mildly elevated today, continue monitoring per primary care.  Patient expressed understanding and was in agreement with this plan. She also understands that She can call clinic at any time with any questions, concerns, or complaints.    Lloyd Huger, MD   05/24/2017 8:02 AM

## 2017-05-21 ENCOUNTER — Inpatient Hospital Stay: Payer: Medicare Other | Attending: Oncology | Admitting: Oncology

## 2017-05-21 ENCOUNTER — Encounter: Payer: Self-pay | Admitting: Oncology

## 2017-05-21 ENCOUNTER — Inpatient Hospital Stay: Payer: Medicare Other

## 2017-05-21 VITALS — BP 144/89 | HR 92 | Temp 99.1°F | Resp 18 | Ht 60.0 in | Wt 150.4 lb

## 2017-05-21 DIAGNOSIS — F17210 Nicotine dependence, cigarettes, uncomplicated: Secondary | ICD-10-CM | POA: Diagnosis not present

## 2017-05-21 DIAGNOSIS — I10 Essential (primary) hypertension: Secondary | ICD-10-CM

## 2017-05-21 DIAGNOSIS — D72829 Elevated white blood cell count, unspecified: Secondary | ICD-10-CM | POA: Diagnosis present

## 2017-05-21 DIAGNOSIS — D751 Secondary polycythemia: Secondary | ICD-10-CM

## 2017-05-21 DIAGNOSIS — E785 Hyperlipidemia, unspecified: Secondary | ICD-10-CM | POA: Diagnosis not present

## 2017-05-21 LAB — CBC WITH DIFFERENTIAL/PLATELET
BASOS PCT: 1 %
Basophils Absolute: 0.1 10*3/uL (ref 0–0.1)
EOS PCT: 0 %
Eosinophils Absolute: 0 10*3/uL (ref 0–0.7)
HCT: 44.9 % (ref 35.0–47.0)
Hemoglobin: 15.3 g/dL (ref 12.0–16.0)
Lymphocytes Relative: 12 %
Lymphs Abs: 2.2 10*3/uL (ref 1.0–3.6)
MCH: 31.6 pg (ref 26.0–34.0)
MCHC: 34.1 g/dL (ref 32.0–36.0)
MCV: 92.7 fL (ref 80.0–100.0)
MONO ABS: 0.5 10*3/uL (ref 0.2–0.9)
Monocytes Relative: 3 %
Neutro Abs: 15.5 10*3/uL — ABNORMAL HIGH (ref 1.4–6.5)
Neutrophils Relative %: 84 %
PLATELETS: 309 10*3/uL (ref 150–440)
RBC: 4.85 MIL/uL (ref 3.80–5.20)
RDW: 12.9 % (ref 11.5–14.5)
WBC: 18.3 10*3/uL — ABNORMAL HIGH (ref 3.6–11.0)

## 2017-05-21 LAB — IRON AND TIBC
IRON: 101 ug/dL (ref 28–170)
SATURATION RATIOS: 28 % (ref 10.4–31.8)
TIBC: 359 ug/dL (ref 250–450)
UIBC: 258 ug/dL

## 2017-05-21 LAB — FERRITIN: FERRITIN: 121 ng/mL (ref 11–307)

## 2017-08-31 NOTE — Progress Notes (Deleted)
[place]  [phone] [phone]  ID: Orlie Dakin OB: [birth year]  MR#: [id number]  XBD#:[id number]  Patient Care Team: Sharyne Peach, MD as PCP - General (Family Medicine)  CHIEF COMPLAINT: Leukocytosis, unspecified; polycythemia, secondary  INTERVAL HISTORY: Patient was last evaluated in clinic greater than 1 year ago.  She returns today for repeat laboratory work and further evaluation. She said that she continues to smoke, but has cut back significantly. Currently she feels well and is asymptomatic. She is no neurologic complaints. She denies any recent fevers or illnesses. She has a good appetite and denies weight loss. She denies any chest pain or shortness of breath. She has no nausea, vomiting, constipation, or diarrhea.  She denies any melena or hematochezia.  She has no urinary complaints. Patient offers no specific complaints today.  REVIEW OF SYSTEMS:   Review of Systems  Constitutional: Negative.  Negative for fever, malaise/fatigue and weight loss.  Respiratory: Negative.  Negative for cough and shortness of breath.   Cardiovascular: Negative.  Negative for chest pain and leg swelling.  Gastrointestinal: Negative.  Negative for abdominal pain, blood in stool and melena.  Genitourinary: Negative.  Negative for dysuria and hematuria.  Musculoskeletal: Negative.   Skin: Negative.  Negative for rash.  Neurological: Negative.  Negative for sensory change, focal weakness and weakness.  Psychiatric/Behavioral: Negative.  The patient is not nervous/anxious.     As per HPI. Otherwise, a complete review of systems is negative.  PAST MEDICAL HISTORY: Past Medical History:  Diagnosis Date  . Allergy   . Arthritis   . Back pain   . Bipolar 1 disorder ([place])   . Blood transfusion without reported diagnosis   . COPD (chronic obstructive pulmonary disease) ([place])   . Depression   . Hepatitis C   . Hyperlipidemia   . Hypertension    . Opiate use    long term use of opiate analgesics.  . Seizures ([place])     PAST SURGICAL HISTORY: Past Surgical History:  Procedure Laterality Date  . ABDOMINAL HYSTERECTOMY      FAMILY HISTORY: Family History  Problem Relation Age of Onset  . Arthritis Mother   . Diabetes Mother   . Hyperlipidemia Mother   . Hypertension Mother   . Miscarriages / [place] Mother   . Breast cancer Neg Hx     ADVANCED DIRECTIVES (Y/N):  N  HEALTH MAINTENANCE: Social History   Tobacco Use  . Smoking status: Current Every Day Smoker    Packs/day: 0.30    Years: 40.00    Pack years: 12.00    Types: Cigarettes  . Smokeless tobacco: Never Used  . Tobacco comment: 3 na day  Substance Use Topics  . Alcohol use: No    Alcohol/week: 0.0 oz  . Drug use: No     Colonoscopy:  PAP:  Bone density:  Lipid panel:  Allergies  Allergen Reactions  . Amitriptyline Anaphylaxis  . Other Shortness Of Breath and Itching    ragweed  . Potassium Chloride Shortness Of Breath  . Potassium-Containing Compounds Shortness Of Breath  . Sulfa Antibiotics Other (See Comments)  . Topical Sulfur Other (See Comments)    Reaction:  Unknown   . Aspirin Rash and Other (See Comments)    Breaks mouth out Other reaction(s): OTHER all over my body  . Codeine Rash    codeine with tylenol  . Latex Rash    Other reaction(s): UNKNOWN Other reaction(s): UNKNOWN  . Topiramate Nausea  And Vomiting  . Tylenol [Acetaminophen] Rash    Current Outpatient Medications  Medication Sig Dispense Refill  . albuterol (PROVENTIL HFA;VENTOLIN HFA) 108 (90 BASE) MCG/ACT inhaler Inhale 2 puffs into the lungs every 6 (six) hours as needed for wheezing or shortness of breath.    . cetirizine (ZYRTEC) 10 MG tablet Take 10 mg by mouth daily.    . diclofenac sodium (VOLTAREN) 1 % GEL Apply 2-4 g to painful area of the knee 4 times per day if tolerated. Patient with strain, sprain, and spasms of the knee 500 g 0  . EPINEPHrine  0.3 mg/0.3 mL IJ SOAJ injection Inject 0.3 mg into the muscle once as needed (for severe allergic reaction).     Marland Kitchen FLUoxetine (PROZAC) 20 MG capsule Take 20 mg by mouth daily.    . fluticasone (FLONASE) 50 MCG/ACT nasal spray Place 2 sprays into both nostrils daily.    . Fluticasone-Salmeterol (ADVAIR) 250-50 MCG/DOSE AEPB Inhale 1 puff into the lungs every 12 (twelve) hours.    . gabapentin (NEURONTIN) 300 MG capsule Take 300 mg by mouth 3 (three) times daily.    . hydrochlorothiazide (HYDRODIURIL) 12.5 MG tablet Take 6.25 mg by mouth daily. Reported on 09/27/2015    . ipratropium-albuterol (DUONEB) 0.5-2.5 (3) MG/3ML SOLN Take 3 mLs by nebulization every 4 (four) hours as needed (for shortness of breath).     . levETIRAcetam (KEPPRA) 500 MG tablet Take 500 mg by mouth 2 (two) times daily.    . nicotine (NICODERM CQ - DOSED IN MG/24 HOURS) 21 mg/24hr patch Place 21 mg onto the skin daily. Reported on 09/27/2015    . oxyCODONE (ROXICODONE) 5 MG immediate release tablet Limit 1 tab by mouth per day or 2-3 times per day if tolerated 90 tablet 0  . predniSONE (DELTASONE) 20 MG tablet Take 20 mg by mouth daily with breakfast.    . simvastatin (ZOCOR) 40 MG tablet Take 40 mg by mouth at bedtime.     Marland Kitchen tiZANidine (ZANAFLEX) 2 MG tablet Take 2 mg by mouth daily.     . traZODone (DESYREL) 150 MG tablet Take 150 mg by mouth at bedtime.     Current Facility-Administered Medications  Medication Dose Route Frequency Provider Last Rate Last Dose  . bupivacaine (PF) (MARCAINE) 0.25 % injection 30 mL  30 mL Other Once Mohammed Kindle, MD      . bupivacaine (PF) (MARCAINE) 0.25 % injection 30 mL  30 mL Other Once Mohammed Kindle, MD      . fentaNYL (SUBLIMAZE) injection 100 mcg  100 mcg Intravenous Once Mohammed Kindle, MD      . fentaNYL (SUBLIMAZE) injection 100 mcg  100 mcg Intravenous Once Mohammed Kindle, MD      . lactated ringers infusion 1,000 mL  1,000 mL Intravenous Continuous Mohammed Kindle, MD      .  lactated ringers infusion 1,000 mL  1,000 mL Intravenous Continuous Mohammed Kindle, MD      . lactated ringers infusion 1,000 mL  1,000 mL Intravenous Continuous Mohammed Kindle, MD      . lactated ringers infusion 1,000 mL  1,000 mL Intravenous Continuous Mohammed Kindle, MD      . lactated ringers infusion 1,000 mL  1,000 mL Intravenous Continuous Mohammed Kindle, MD      . lactated ringers infusion 1,000 mL  1,000 mL Intravenous Continuous Mohammed Kindle, MD 125 mL/hr at 10/29/15 0810 1,000 mL at 10/29/15 0810  . lidocaine (PF) (XYLOCAINE) 1 % injection 10  mL  10 mL Subcutaneous Once Mohammed Kindle, MD      . lidocaine (PF) (XYLOCAINE) 1 % injection 10 mL  10 mL Subcutaneous Once Mohammed Kindle, MD      . midazolam (VERSED) 5 MG/5ML injection 5 mg  5 mg Intravenous Once Mohammed Kindle, MD      . midazolam (VERSED) 5 MG/5ML injection 5 mg  5 mg Intravenous Once Mohammed Kindle, MD      . orphenadrine (NORFLEX) injection 60 mg  60 mg Intramuscular Once Mohammed Kindle, MD      . orphenadrine (NORFLEX) injection 60 mg  60 mg Intramuscular Once Mohammed Kindle, MD      . orphenadrine (NORFLEX) injection 60 mg  60 mg Intramuscular Once Mohammed Kindle, MD      . orphenadrine (NORFLEX) injection 60 mg  60 mg Intramuscular Once Mohammed Kindle, MD      . sodium chloride 0.9 % injection 20 mL  20 mL Other Once Mohammed Kindle, MD      . triamcinolone acetonide (KENALOG-40) injection 40 mg  40 mg Other Once Mohammed Kindle, MD      . triamcinolone acetonide (KENALOG-40) injection 40 mg  40 mg Other Once Mohammed Kindle, MD        OBJECTIVE: There were no vitals filed for this visit.   There is no height or weight on file to calculate BMI.    ECOG FS:0 - Asymptomatic  General: Well-developed, well-nourished, no acute distress. Eyes: Pink conjunctiva, anicteric sclera. Lungs: Clear to auscultation bilaterally. Heart: Regular rate and rhythm. No rubs, murmurs, or gallops. Abdomen: Soft, nontender, nondistended.  No organomegaly noted, normoactive bowel sounds. Musculoskeletal: No edema, cyanosis, or clubbing. Neuro: Alert, answering all questions appropriately. Cranial nerves grossly intact. Skin: No rashes or petechiae noted. Psych: Normal affect.   LAB RESULTS:  Lab Results  Component Value Date   NA 138 06/09/2015   K 3.6 06/09/2015   CL 106 06/09/2015   CO2 24 06/09/2015   GLUCOSE 116 (H) 06/09/2015   BUN 6 06/09/2015   CREATININE 0.81 06/09/2015   CALCIUM 9.3 06/09/2015   PROT 7.3 06/09/2015   ALBUMIN 4.0 06/09/2015   AST 20 06/09/2015   ALT 14 06/09/2015   ALKPHOS 85 06/09/2015   BILITOT 0.6 06/09/2015   GFRNONAA >60 06/09/2015   GFRAA >60 06/09/2015    Lab Results  Component Value Date   WBC 18.3 (H) 05/21/2017   NEUTROABS 15.5 (H) 05/21/2017   HGB 15.3 05/21/2017   HCT 44.9 05/21/2017   MCV 92.7 05/21/2017   PLT 309 05/21/2017     STUDIES: No results found.  ASSESSMENT: Leukocytosis, unspecified; polycythemia, secondary.   PLAN:    1. Leukocytosis, unspecified:  Patient's white blood cell count is slightly increased today, but patient's baseline appears to be between 12.0 and 19.3 over the past several years. Previously, all of her other laboratory work including flow cytometry was reported as negative.  Will get BCR-ABL at next lab draw for completeness.  Patient does not require bone marrow biopsy at this time. Return to clinic in 3 months with repeat laboratory work and further evaluation. If everything remains stable, she likely can be discharged from clinic. 2. Polycythemia, secondary: Resolved.  Likely secondary to heavy tobacco use which patient is decreasing.  Patient reports she was receiving phlebotomy in the past, but we do not have documentation of this. Her hemoglobin has improved since decreasing her tobacco use.  JAK-2 mutation, iron stores, and erythropoietin levels are  all negative or within normal limits.  3. Hypertension: Patient's blood pressure is  mildly elevated today, continue monitoring per primary care.  Patient expressed understanding and was in agreement with this plan. She also understands that She can call clinic at any time with any questions, concerns, or complaints.    Lloyd Huger, MD   08/31/2017 10:43 PM

## 2017-09-02 ENCOUNTER — Inpatient Hospital Stay: Payer: Medicare Other

## 2017-09-02 ENCOUNTER — Inpatient Hospital Stay: Payer: Medicare Other | Admitting: Oncology

## 2017-12-20 NOTE — Progress Notes (Signed)
[facility]  [phone] [phone]  ID: Sherri Foster OB: 1960-08-29  MR#: [id number]  VHQ#:[id number]  Patient Care Team: Rayetta Humphrey, MD as PCP - General (Family Medicine)  CHIEF COMPLAINT: Leukocytosis, unspecified; polycythemia, secondary  INTERVAL HISTORY: Patient returns to clinic today for repeat laboratory work and further evaluation.  She currently feels well and is asymptomatic.  She has no neurologic complaints.  She denies any recent fevers or illnesses. She has a good appetite and denies weight loss. She denies any chest pain or shortness of breath. She has no nausea, vomiting, constipation, or diarrhea.  She denies any melena or hematochezia.  She has no urinary complaints.  Patient feels at her baseline offers no specific complaints today.  REVIEW OF SYSTEMS:   Review of Systems  Constitutional: Negative.  Negative for fever, malaise/fatigue and weight loss.  Respiratory: Negative.  Negative for cough and shortness of breath.   Cardiovascular: Negative.  Negative for chest pain and leg swelling.  Gastrointestinal: Negative.  Negative for abdominal pain, blood in stool and melena.  Genitourinary: Negative.  Negative for dysuria and hematuria.  Musculoskeletal: Negative.  Negative for back pain.  Skin: Negative.  Negative for rash.  Neurological: Negative.  Negative for sensory change, focal weakness and weakness.  Psychiatric/Behavioral: Negative.  The patient is not nervous/anxious.     As per HPI. Otherwise, a complete review of systems is negative.  PAST MEDICAL HISTORY: Past Medical History:  Diagnosis Date  . Allergy   . Arthritis   . Back pain   . Bipolar 1 disorder ([facility])   . Blood transfusion without reported diagnosis   . COPD (chronic obstructive pulmonary disease) ([facility])   . Depression   . Hepatitis C   . Hyperlipidemia   . Hypertension   . Opiate use    long term use of opiate analgesics.  . Seizures  ([facility])     PAST SURGICAL HISTORY: Past Surgical History:  Procedure Laterality Date  . ABDOMINAL HYSTERECTOMY      FAMILY HISTORY: Family History  Problem Relation Age of Onset  . Arthritis Mother   . Diabetes Mother   . Hyperlipidemia Mother   . Hypertension Mother   . Miscarriages / [place] Mother   . Breast cancer Neg Hx     ADVANCED DIRECTIVES (Y/N):  N  HEALTH MAINTENANCE: Social History   Tobacco Use  . Smoking status: Current Every Day Smoker    Packs/day: 0.30    Years: 40.00    Pack years: 12.00    Types: Cigarettes  . Smokeless tobacco: Never Used  . Tobacco comment: 3 na day  Substance Use Topics  . Alcohol use: No    Alcohol/week: 0.0 standard drinks  . Drug use: No     Colonoscopy:  PAP:  Bone density:  Lipid panel:  Allergies  Allergen Reactions  . Amitriptyline Anaphylaxis  . Other Shortness Of Breath and Itching    ragweed  . Potassium Chloride Shortness Of Breath  . Potassium-Containing Compounds Shortness Of Breath  . Sulfa Antibiotics Other (See Comments)  . Topical Sulfur Other (See Comments)    Reaction:  Unknown   . Aspirin Rash and Other (See Comments)    Breaks mouth out Other reaction(s): OTHER all over my body  . Codeine Rash    codeine with tylenol  . Latex Rash    Other reaction(s): UNKNOWN Other reaction(s): UNKNOWN  . Topiramate Nausea And Vomiting  . Tylenol [Acetaminophen] Rash  Current Outpatient Medications  Medication Sig Dispense Refill  . albuterol (PROVENTIL HFA;VENTOLIN HFA) 108 (90 BASE) MCG/ACT inhaler Inhale 2 puffs into the lungs every 6 (six) hours as needed for wheezing or shortness of breath.    . cetirizine (ZYRTEC) 10 MG tablet Take 10 mg by mouth daily.    . diclofenac sodium (VOLTAREN) 1 % GEL Apply 2-4 g to painful area of the knee 4 times per day if tolerated. Patient with strain, sprain, and spasms of the knee 500 g 0  . EPINEPHrine 0.3 mg/0.3 mL IJ SOAJ injection Inject 0.3 mg into the  muscle once as needed (for severe allergic reaction).     Marland Kitchen FLUoxetine (PROZAC) 20 MG capsule Take 20 mg by mouth daily.    . fluticasone (FLONASE) 50 MCG/ACT nasal spray Place 2 sprays into both nostrils daily.    . Fluticasone-Salmeterol (ADVAIR) 250-50 MCG/DOSE AEPB Inhale 1 puff into the lungs every 12 (twelve) hours.    . gabapentin (NEURONTIN) 300 MG capsule Take 300 mg by mouth 3 (three) times daily.    . hydrochlorothiazide (HYDRODIURIL) 12.5 MG tablet Take 6.25 mg by mouth daily. Reported on 09/27/2015    . ipratropium-albuterol (DUONEB) 0.5-2.5 (3) MG/3ML SOLN Take 3 mLs by nebulization every 4 (four) hours as needed (for shortness of breath).     . levETIRAcetam (KEPPRA) 500 MG tablet Take 500 mg by mouth 2 (two) times daily.    . nicotine (NICODERM CQ - DOSED IN MG/24 HOURS) 21 mg/24hr patch Place 21 mg onto the skin daily. Reported on 09/27/2015    . simvastatin (ZOCOR) 40 MG tablet Take 40 mg by mouth at bedtime.     Marland Kitchen tiZANidine (ZANAFLEX) 2 MG tablet Take 2 mg by mouth daily.     . traZODone (DESYREL) 150 MG tablet Take 150 mg by mouth at bedtime.     Current Facility-Administered Medications  Medication Dose Route Frequency Provider Last Rate Last Dose  . bupivacaine (PF) (MARCAINE) 0.25 % injection 30 mL  30 mL Other Once Ewing Schlein, MD      . bupivacaine (PF) (MARCAINE) 0.25 % injection 30 mL  30 mL Other Once Ewing Schlein, MD      . fentaNYL (SUBLIMAZE) injection 100 mcg  100 mcg Intravenous Once Ewing Schlein, MD      . fentaNYL (SUBLIMAZE) injection 100 mcg  100 mcg Intravenous Once Ewing Schlein, MD      . lactated ringers infusion 1,000 mL  1,000 mL Intravenous Continuous Ewing Schlein, MD      . lactated ringers infusion 1,000 mL  1,000 mL Intravenous Continuous Ewing Schlein, MD      . lactated ringers infusion 1,000 mL  1,000 mL Intravenous Continuous Ewing Schlein, MD      . lactated ringers infusion 1,000 mL  1,000 mL Intravenous Continuous Ewing Schlein,  MD      . lactated ringers infusion 1,000 mL  1,000 mL Intravenous Continuous Ewing Schlein, MD      . lactated ringers infusion 1,000 mL  1,000 mL Intravenous Continuous Ewing Schlein, MD 125 mL/hr at 10/29/15 0810 1,000 mL at 10/29/15 0810  . lidocaine (PF) (XYLOCAINE) 1 % injection 10 mL  10 mL Subcutaneous Once Ewing Schlein, MD      . lidocaine (PF) (XYLOCAINE) 1 % injection 10 mL  10 mL Subcutaneous Once Ewing Schlein, MD      . midazolam (VERSED) 5 MG/5ML injection 5 mg  5 mg Intravenous Once Ewing Schlein, MD      .  midazolam (VERSED) 5 MG/5ML injection 5 mg  5 mg Intravenous Once Ewing Schlein, MD      . orphenadrine (NORFLEX) injection 60 mg  60 mg Intramuscular Once Ewing Schlein, MD      . orphenadrine (NORFLEX) injection 60 mg  60 mg Intramuscular Once Ewing Schlein, MD      . orphenadrine (NORFLEX) injection 60 mg  60 mg Intramuscular Once Ewing Schlein, MD      . orphenadrine (NORFLEX) injection 60 mg  60 mg Intramuscular Once Ewing Schlein, MD      . sodium chloride 0.9 % injection 20 mL  20 mL Other Once Ewing Schlein, MD      . triamcinolone acetonide (KENALOG-40) injection 40 mg  40 mg Other Once Ewing Schlein, MD      . triamcinolone acetonide (KENALOG-40) injection 40 mg  40 mg Other Once Ewing Schlein, MD        OBJECTIVE: Vitals:   12/24/17 1513 12/24/17 1520  BP:  (!) 152/89  Pulse:  83  Resp: 12   Temp:  98.1 F (36.7 C)     Body mass index is 30.7 kg/m.    ECOG FS:0 - Asymptomatic  General: Well-developed, well-nourished, no acute distress. Eyes: Pink conjunctiva, anicteric sclera. HEENT: Normocephalic, moist mucous membranes, clear oropharnyx. Lungs: Clear to auscultation bilaterally. Heart: Regular rate and rhythm. No rubs, murmurs, or gallops. Abdomen: Soft, nontender, nondistended. No organomegaly noted, normoactive bowel sounds. Musculoskeletal: No edema, cyanosis, or clubbing. Neuro: Alert, answering all questions appropriately. Cranial  nerves grossly intact. Skin: No rashes or petechiae noted. Psych: Normal affect.  LAB RESULTS:  Lab Results  Component Value Date   NA 138 06/09/2015   K 3.6 06/09/2015   CL 106 06/09/2015   CO2 24 06/09/2015   GLUCOSE 116 (H) 06/09/2015   BUN 6 06/09/2015   CREATININE 0.81 06/09/2015   CALCIUM 9.3 06/09/2015   PROT 7.3 06/09/2015   ALBUMIN 4.0 06/09/2015   AST 20 06/09/2015   ALT 14 06/09/2015   ALKPHOS 85 06/09/2015   BILITOT 0.6 06/09/2015   GFRNONAA >60 06/09/2015   GFRAA >60 06/09/2015    Lab Results  Component Value Date   WBC 12.3 (H) 12/24/2017   NEUTROABS 6.2 12/24/2017   HGB 14.6 12/24/2017   HCT 44.1 12/24/2017   MCV 92.3 12/24/2017   PLT 323 12/24/2017     STUDIES: No results found.  ASSESSMENT: Leukocytosis, unspecified; polycythemia, secondary.   PLAN:    1. Leukocytosis, unspecified: Patient's white blood cell count remains slightly elevated at 12.3 today, but is approximately her baseline.  Her count has been persistently elevated for the past several years ranging between 12.0 and 19.3. Previously, all of her other laboratory work including flow cytometry was reported as negative.  BCR-ABL was drawn today for completeness.  No intervention is needed at this time.  After discussion with the patient, it was agreed upon that no further follow-up is necessary.  Please monitor her CBC 1-2 times per year and if there is any questions or concerns please refer patient back for further evaluation.   2. Polycythemia, secondary: Resolved.  Likely secondary to heavy tobacco use which patient is decreasing.  Patient reports she was receiving phlebotomy in the past, but we do not have documentation of this. Her hemoglobin has improved since decreasing her tobacco use.  JAK-2 mutation, iron stores, and erythropoietin levels are all negative or within normal limits.  3. Hypertension: Chronic and unchanged.  Patient's blood pressure remains  persistently elevated.   Continue monitoring and treatment per primary care.  Patient expressed understanding and was in agreement with this plan. She also understands that She can call clinic at any time with any questions, concerns, or complaints.    Jeralyn Ruths, MD   12/26/2017 8:32 AM

## 2017-12-24 ENCOUNTER — Inpatient Hospital Stay: Payer: Medicare Other

## 2017-12-24 ENCOUNTER — Inpatient Hospital Stay: Payer: Medicare Other | Attending: Oncology | Admitting: Oncology

## 2017-12-24 ENCOUNTER — Other Ambulatory Visit: Payer: Self-pay

## 2017-12-24 ENCOUNTER — Encounter: Payer: Self-pay | Admitting: Oncology

## 2017-12-24 VITALS — BP 152/89 | HR 83 | Temp 98.1°F | Resp 12 | Ht 60.0 in | Wt 157.2 lb

## 2017-12-24 DIAGNOSIS — D72829 Elevated white blood cell count, unspecified: Secondary | ICD-10-CM | POA: Insufficient documentation

## 2017-12-24 DIAGNOSIS — D751 Secondary polycythemia: Secondary | ICD-10-CM | POA: Insufficient documentation

## 2017-12-24 DIAGNOSIS — I10 Essential (primary) hypertension: Secondary | ICD-10-CM | POA: Diagnosis not present

## 2017-12-24 LAB — CBC WITH DIFFERENTIAL/PLATELET
Abs Immature Granulocytes: 0.07 10*3/uL (ref 0.00–0.07)
Basophils Absolute: 0.1 10*3/uL (ref 0.0–0.1)
Basophils Relative: 1 %
EOS PCT: 2 %
Eosinophils Absolute: 0.2 10*3/uL (ref 0.0–0.5)
HEMATOCRIT: 44.1 % (ref 36.0–46.0)
HEMOGLOBIN: 14.6 g/dL (ref 12.0–15.0)
Immature Granulocytes: 1 %
LYMPHS ABS: 4.8 10*3/uL — AB (ref 0.7–4.0)
Lymphocytes Relative: 39 %
MCH: 30.5 pg (ref 26.0–34.0)
MCHC: 33.1 g/dL (ref 30.0–36.0)
MCV: 92.3 fL (ref 80.0–100.0)
MONO ABS: 1 10*3/uL (ref 0.1–1.0)
MONOS PCT: 8 %
NRBC: 0 % (ref 0.0–0.2)
Neutro Abs: 6.2 10*3/uL (ref 1.7–7.7)
Neutrophils Relative %: 49 %
Platelets: 323 10*3/uL (ref 150–400)
RBC: 4.78 MIL/uL (ref 3.87–5.11)
RDW: 12 % (ref 11.5–15.5)
WBC: 12.3 10*3/uL — ABNORMAL HIGH (ref 4.0–10.5)

## 2017-12-24 NOTE — Progress Notes (Signed)
Patient here for follow up. No changes since her last appointment. 

## 2017-12-28 ENCOUNTER — Emergency Department (HOSPITAL_COMMUNITY)
Admission: EM | Admit: 2017-12-28 | Discharge: 2017-12-28 | Disposition: A | Discharge status: Home / Self Care | Payer: Medicare Other | Attending: Emergency Medicine | Admitting: Emergency Medicine

## 2017-12-28 ENCOUNTER — Other Ambulatory Visit: Payer: Self-pay

## 2017-12-28 ENCOUNTER — Encounter (HOSPITAL_COMMUNITY): Payer: Self-pay

## 2017-12-28 DIAGNOSIS — Z87820 Personal history of traumatic brain injury: Secondary | ICD-10-CM | POA: Insufficient documentation

## 2017-12-28 DIAGNOSIS — J449 Chronic obstructive pulmonary disease, unspecified: Secondary | ICD-10-CM | POA: Insufficient documentation

## 2017-12-28 DIAGNOSIS — G43909 Migraine, unspecified, not intractable, without status migrainosus: Secondary | ICD-10-CM | POA: Diagnosis present

## 2017-12-28 DIAGNOSIS — I10 Essential (primary) hypertension: Secondary | ICD-10-CM | POA: Insufficient documentation

## 2017-12-28 DIAGNOSIS — F0280 Dementia in other diseases classified elsewhere without behavioral disturbance: Secondary | ICD-10-CM | POA: Diagnosis not present

## 2017-12-28 DIAGNOSIS — Z79899 Other long term (current) drug therapy: Secondary | ICD-10-CM | POA: Diagnosis not present

## 2017-12-28 DIAGNOSIS — F17210 Nicotine dependence, cigarettes, uncomplicated: Secondary | ICD-10-CM | POA: Diagnosis not present

## 2017-12-28 LAB — BASIC METABOLIC PANEL
ANION GAP: 7 (ref 5–15)
BUN: 6 mg/dL (ref 6–20)
CO2: 26 mmol/L (ref 22–32)
Calcium: 8.8 mg/dL — ABNORMAL LOW (ref 8.9–10.3)
Chloride: 105 mmol/L (ref 98–111)
Creatinine, Ser: 0.82 mg/dL (ref 0.44–1.00)
GFR calc non Af Amer: >60 mL/min (ref >60)
Glucose, Bld: 111 mg/dL — ABNORMAL HIGH (ref 70–99)
POTASSIUM: 3.4 mmol/L — AB (ref 3.5–5.1)
SODIUM: 138 mmol/L (ref 135–145)

## 2017-12-28 LAB — CBC
HCT: 46.2 % — ABNORMAL HIGH (ref 36.0–46.0)
Hemoglobin: 14.6 g/dL (ref 12.0–15.0)
MCH: 30.3 pg (ref 26.0–34.0)
MCHC: 31.6 g/dL (ref 30.0–36.0)
MCV: 95.9 fL (ref 80.0–100.0)
NRBC: 0 % (ref 0.0–0.2)
PLATELETS: 339 10*3/uL (ref 150–400)
RBC: 4.82 MIL/uL (ref 3.87–5.11)
RDW: 12.2 % (ref 11.5–15.5)
WBC: 11.4 10*3/uL — AB (ref 4.0–10.5)

## 2017-12-28 MED ORDER — DIPHENHYDRAMINE HCL 25 MG PO CAPS
25.0000 mg | ORAL_CAPSULE | Freq: Once | ORAL | Status: AC
  Administered 2017-12-28: 25 mg via ORAL
  Filled 2017-12-28: qty 1

## 2017-12-28 MED ORDER — KETOROLAC TROMETHAMINE 30 MG/ML IJ SOLN
30.0000 mg | Freq: Once | INTRAMUSCULAR | Status: AC
  Administered 2017-12-28: 30 mg via INTRAMUSCULAR
  Filled 2017-12-28: qty 1

## 2017-12-28 MED ORDER — METOCLOPRAMIDE HCL 10 MG PO TABS
5.0000 mg | ORAL_TABLET | Freq: Once | ORAL | Status: AC
  Administered 2017-12-28: 5 mg via ORAL
  Filled 2017-12-28: qty 1

## 2017-12-28 NOTE — ED Notes (Signed)
Patient verbalizes understanding of discharge instructions. Opportunity for questioning and answers were provided. Armband removed by staff, pt discharged from ED.  

## 2017-12-28 NOTE — Discharge Instructions (Addendum)
Please read attached information. If you experience any new or worsening signs or symptoms please return to the emergency room for evaluation. Please follow-up with your primary care provider or specialist as discussed.  °

## 2017-12-28 NOTE — ED Triage Notes (Signed)
Pt here with a migraine for the last 2 days.  Said she gets migraines freequently and cannot get this one to go away.  Hx HTN, 181/98 in triage.  Nausea this am but now resolved.  No difficulty walking and no blurred vision.

## 2017-12-28 NOTE — ED Provider Notes (Signed)
[facility] EMERGENCY DEPARTMENT Provider Note   CSN: [id number] Arrival date & time: 12/28/17  1531     History   Chief Complaint Chief Complaint  Patient presents with  . Migraine    x2 days    HPI Mila Pair is a 57 y.o. female.  HPI   57 year old female presents today with complaints of migraine.  Patient notes 2 days ago she developed a throbbing sensation to the posterior aspect of her head.  She notes some associated nausea and one episode of vomiting this morning that has resolved.  She notes continued pain, denies any neurological deficits, vision changes, neck stiffness or fever.  She notes this is identical to previous migraines which she is had for several years.  She notes usually tenacity and improves her symptoms but did not today.  She denies any change in quality or characteristics of her migraine, she denies any other red flags.  Past Medical History:  Diagnosis Date  . Allergy   . Arthritis   . Back pain   . Bipolar 1 disorder (HCC)   . Blood transfusion without reported diagnosis   . COPD (chronic obstructive pulmonary disease) (HCC)   . Depression   . Hepatitis C   . Hyperlipidemia   . Hypertension   . Opiate use    long term use of opiate analgesics.  . Seizures [facility])     Patient Active Problem List   Diagnosis Date Noted  . Airway hyperreactivity 12/11/2015  . Moderate COPD (chronic obstructive pulmonary disease) (HCC) 12/11/2015  . Clinical depression 12/11/2015  . Need for vaccination 12/11/2015  . HCV (hepatitis C virus) 12/11/2015  . Hepatitis C, chronic (HCC) 12/11/2015  . 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 Leukocytosis 12/10/2015  . DJD (degenerative joint disease) of knee 09/27/2015  . Bipolar 1 disorder, depressed (HCC) 05/20/2015  . Recurrent major depression in partial remission (HCC) 05/20/2015  . 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 PTSD (post-traumatic stress disorder) 10/19/2014  . 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 Essential hypertension 08/21/2014  . Degeneration of intervertebral disc of lumbar region 07/20/2014  . Sacroiliac joint disease 07/20/2014  . Bilateral occipital neuralgia 07/20/2014  . Facet syndrome, lumbar 07/20/2014  . Hyperlipidemia 03/07/2014  . Anxiety 12/05/2013  . Chronic hepatitis C without hepatic coma (HCC) 12/02/2013  . Chronic obstructive pulmonary disease (HCC) 12/02/2013  . Back pain 06/04/2013  . Polycythemia, secondary 06/04/2013  . Family history of diabetes mellitus 05/12/2013  . Low TSH level 05/12/2013  . Hypoxia 01/31/2013  . Chronic sinusitis 12/05/2012  . Middle ear effusion 12/05/2012  . Migraine 10/20/2012  . Joint pain 09/17/2012  . Asthma, extrinsic 07/12/2012  . Rhinitis, allergic 07/12/2012  . Hypomagnesemia 07/09/2012  . Poor compliance 07/05/2012  . Depression 04/23/2012  . Bipolar 1 disorder (HCC) 02/11/2012  . 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 Chronic back pain 02/11/2012  . COPD (chronic obstructive pulmonary disease) (HCC) 02/11/2012  . Hepatitis C 02/11/2012  . Long term current use of opiate analgesic 02/11/2012  . Memory loss 02/11/2012  . Seizure disorder (HCC) 02/11/2012  . Referral of patient 02/11/2012  . Tobacco use 02/11/2012  . Traumatic brain injury (HCC) 12/16/2011  . Bipolar affective disorder, depressed (HCC) 11/29/2008  . Hepatitis C carrier (HCC) 11/29/2008  . Hypercholesterolemia 11/29/2008  . Hypertension, benign 11/29/2008  . Personal history of tobacco use, presenting hazards to health  11/29/2008  . Do not give narcotics 08/17/2008  . Pain medication agreement broken 08/17/2008    Past Surgical History:  Procedure Laterality Date  . ABDOMINAL HYSTERECTOMY       OB History   None      Home Medications    Prior to Admission medications   Medication Sig Start Date End  Date Taking? Authorizing Provider  albuterol (PROVENTIL HFA;VENTOLIN HFA) 108 (90 BASE) MCG/ACT inhaler Inhale 2 puffs into the lungs every 6 (six) hours as needed for wheezing or shortness of breath.    [provider]  cetirizine (ZYRTEC) 10 MG tablet Take 10 mg by mouth daily.    [provider]  diclofenac sodium (VOLTAREN) 1 % GEL Apply 2-4 g to painful area of the knee 4 times per day if tolerated. Patient with strain, sprain, and spasms of the knee 09/27/15   Ewing Schlein, MD  EPINEPHrine 0.3 mg/0.3 mL IJ SOAJ injection Inject 0.3 mg into the muscle once as needed (for severe allergic reaction).     [provider]  FLUoxetine (PROZAC) 20 MG capsule Take 20 mg by mouth daily.    [provider]  fluticasone (FLONASE) 50 MCG/ACT nasal spray Place 2 sprays into both nostrils daily.    [provider]  Fluticasone-Salmeterol (ADVAIR) 250-50 MCG/DOSE AEPB Inhale 1 puff into the lungs every 12 (twelve) hours.    [provider]  gabapentin (NEURONTIN) 300 MG capsule Take 300 mg by mouth 3 (three) times daily.    [provider]  hydrochlorothiazide (HYDRODIURIL) 12.5 MG tablet Take 6.25 mg by mouth daily. Reported on 09/27/2015    [provider]  ipratropium-albuterol (DUONEB) 0.5-2.5 (3) MG/3ML SOLN Take 3 mLs by nebulization every 4 (four) hours as needed (for shortness of breath).     [provider]  levETIRAcetam (KEPPRA) 500 MG tablet Take 500 mg by mouth 2 (two) times daily.    [provider]  nicotine (NICODERM CQ - DOSED IN MG/24 HOURS) 21 mg/24hr patch Place 21 mg onto the skin daily. Reported on 09/27/2015    [provider]  simvastatin (ZOCOR) 40 MG tablet Take 40 mg by mouth at bedtime.     [provider]  tiZANidine (ZANAFLEX) 2 MG tablet Take 2 mg by mouth daily.     [provider]  traZODone (DESYREL) 150 MG tablet Take 150 mg by mouth at bedtime.    [provider]    Family History Family History  Problem Relation Age of Onset  . Arthritis Mother   . Diabetes Mother   . Hyperlipidemia Mother   . Hypertension Mother   . Miscarriages / [place] Mother   . Breast cancer Neg Hx     Social History Social History   Tobacco Use  . Smoking status: Current Every Day Smoker    Packs/day: 0.30    Years: 40.00    Pack years: 12.00    Types: Cigarettes  . Smokeless tobacco: Never Used  . Tobacco comment: 3 na day  Substance Use Topics  . Alcohol use: No    Alcohol/week: 0.0 standard drinks  . Drug use: No     Allergies   Amitriptyline; Other; Potassium chloride; Potassium-containing compounds; Sulfa antibiotics; Topical sulfur; Aspirin; Codeine; Latex; Topiramate; and Tylenol [acetaminophen]   Review of Systems Review of Systems  All other systems reviewed and are negative.  Physical Exam Updated Vital Signs BP (!) 150/88 (BP Location: Right Arm)   Pulse 78  Temp 97.9 F (36.6 C)   Resp 18   SpO2 94%   Physical Exam  Constitutional: She is oriented to person, place, and time. She appears well-developed and well-nourished.  HENT:  Head: Normocephalic and atraumatic.  Eyes: Pupils are equal, round, and reactive to light. Conjunctivae are normal. Right eye exhibits no discharge. Left eye exhibits no discharge. No scleral icterus.  Neck: Normal range of motion. No JVD present. No tracheal deviation present.  Supple full active range of motion  Pulmonary/Chest: Effort normal. No stridor.  Neurological: She is alert and oriented to person, place, and time. She has normal strength. No cranial nerve deficit or sensory deficit. Coordination normal. GCS eye subscore is 4. GCS verbal subscore is 5. GCS motor subscore is 6.  Psychiatric: She has a normal mood and affect. Her behavior is normal. Judgment and thought content normal.  Nursing note and vitals reviewed.    ED Treatments / Results  Labs (all labs ordered  are listed, but only abnormal results are displayed) Labs Reviewed  CBC - Abnormal; Notable for the following components:      Result Value   WBC 11.4 (*)    HCT 46.2 (*)    All other components within normal limits  BASIC METABOLIC PANEL - Abnormal; Notable for the following components:   Potassium 3.4 (*)    Glucose, Bld 111 (*)    Calcium 8.8 (*)    All other components within normal limits    EKG None  Radiology No results found.  Procedures Procedures (including critical care time)  Medications Ordered in ED Medications  ketorolac (TORADOL) 30 MG/ML injection 30 mg (30 mg Intramuscular Given 12/28/17 2020)  metoCLOPramide (REGLAN) tablet 5 mg (5 mg Oral Given 12/28/17 2019)  diphenhydrAMINE (BENADRYL) capsule 25 mg (25 mg Oral Given 12/28/17 2020)     Initial Impression / Assessment and Plan / ED Course  I have reviewed the triage vital signs and the nursing notes.  Pertinent labs & imaging results that were available during my care of the patient were reviewed by me and considered in my medical decision making (see chart for details).     57 year old female presents today with complaints of migraine.  This is typical of previous, no red flags.  Treated here symptomatically with Toradol, Reglan, Benadryl and Decadron.  She had symptom medic improvement, discharged with outpatient follow-up and strict return precautions.  Verbalized understanding and agreement to today's plan had no further questions or concerns.  Final Clinical Impressions(s) / ED Diagnoses   Final diagnoses:  Migraine without status migrainosus, not intractable, unspecified migraine type    ED Discharge Orders    None       Rosalio Loud 12/28/17 2159    Azalia Bilis, MD 12/28/17 [phone]

## 2017-12-29 LAB — BCR-ABL1, CML/ALL, PCR, QUANT

## 2017-12-31 ENCOUNTER — Encounter (HOSPITAL_COMMUNITY): Payer: Self-pay | Admitting: Emergency Medicine

## 2017-12-31 ENCOUNTER — Emergency Department (HOSPITAL_COMMUNITY)
Admission: EM | Admit: 2017-12-31 | Discharge: 2017-12-31 | Disposition: A | Discharge status: Home / Self Care | Payer: Medicare Other | Attending: Emergency Medicine | Admitting: Emergency Medicine

## 2017-12-31 DIAGNOSIS — Z91040 Latex allergy status: Secondary | ICD-10-CM | POA: Insufficient documentation

## 2017-12-31 DIAGNOSIS — J449 Chronic obstructive pulmonary disease, unspecified: Secondary | ICD-10-CM | POA: Diagnosis not present

## 2017-12-31 DIAGNOSIS — F0390 Unspecified dementia without behavioral disturbance: Secondary | ICD-10-CM | POA: Insufficient documentation

## 2017-12-31 DIAGNOSIS — Z79899 Other long term (current) drug therapy: Secondary | ICD-10-CM | POA: Diagnosis not present

## 2017-12-31 DIAGNOSIS — R51 Headache: Secondary | ICD-10-CM | POA: Diagnosis present

## 2017-12-31 DIAGNOSIS — I10 Essential (primary) hypertension: Secondary | ICD-10-CM | POA: Diagnosis not present

## 2017-12-31 DIAGNOSIS — R519 Headache, unspecified: Secondary | ICD-10-CM

## 2017-12-31 DIAGNOSIS — Z760 Encounter for issue of repeat prescription: Secondary | ICD-10-CM

## 2017-12-31 DIAGNOSIS — F17210 Nicotine dependence, cigarettes, uncomplicated: Secondary | ICD-10-CM | POA: Diagnosis not present

## 2017-12-31 MED ORDER — HYDROCHLOROTHIAZIDE 12.5 MG PO TABS: 6.2500 mg | ORAL_TABLET | Freq: Every day | ORAL | 0 refills | Status: AC

## 2017-12-31 MED ORDER — KETOROLAC TROMETHAMINE 60 MG/2ML IM SOLN
30.0000 mg | Freq: Once | INTRAMUSCULAR | Status: AC
  Administered 2017-12-31: 30 mg via INTRAMUSCULAR
  Filled 2017-12-31: qty 2

## 2017-12-31 MED ORDER — DIPHENHYDRAMINE HCL 25 MG PO CAPS
25.0000 mg | ORAL_CAPSULE | Freq: Once | ORAL | Status: AC
  Administered 2017-12-31: 25 mg via ORAL
  Filled 2017-12-31: qty 1

## 2017-12-31 MED ORDER — METOCLOPRAMIDE HCL 10 MG PO TABS
5.0000 mg | ORAL_TABLET | Freq: Once | ORAL | Status: AC
  Administered 2017-12-31: 5 mg via ORAL
  Filled 2017-12-31: qty 1

## 2017-12-31 NOTE — Discharge Instructions (Signed)
You were seen in the emergency department today for a headache and a medication refill.  We have given you a short term prescription for hydrochlorothiazide.  We would like you to make sure that you take this daily and monitor your blood pressures daily.  Please eat foods that are rich in potassium as this medicine can lower your potassium.  We would like you to follow-up with your primary care provider as scheduled as they will be further managing her blood pressure.  We have also given you information for neurologist to see in the area for your migraines.  Return to the ER for new or worsening symptoms or any other concerns.

## 2017-12-31 NOTE — ED Notes (Signed)
Pt requesting refill of her hydrochlorothiazide and a referral to a new primary doctor.

## 2017-12-31 NOTE — ED Provider Notes (Signed)
[facility] EMERGENCY DEPARTMENT Provider Note   CSN: [id number] Arrival date & time: 12/31/17  4034     History   Chief Complaint Medication Refill Headache  HPI Sherri Foster is a 57 y.o. female with a hx of tobacco abuse, bipolar 1 disorder, depression, COPD, hyperlipidemia, HTN, chronic hep C, bilateral occipital neuralgia, MR, prior TBI and migraines who presents to the ED with chief complaint of medication refill today.  She states that she was previously on hydrochlorothiazide 6.25 mg daily for several years, she has been off of it for several months, and has been noted that her blood pressures have been high at home running in the 160s-170s systolic at times. She had no issues when taking this medication previously and did not take a potassium supplement with it. She is unable to see PCP until next week, but would really like this re-started due to her BP being high and that she feels her elevated BP is further irrigating her headache. She reports headache that started gradually about 6 days ago and had steady progression. Pain is in the posterior head, throbbing in nature, with nausea with 1 episode of non bloody emesis.  Pain is a 9 out of 10 in severity, only alleviating factor has been a migraine cocktail she received a prior ER visit which temporarily resolved this. She is requesting same meds today. no aggravating factors.  She states this feels exactly the same as previous migraines which she has had issues with for several years.  Denies change in vision, numbness, weakness, paresthesias, dizziness, fever, or neck stiffness.  Denies recent head trauma or injury.  HPI  Past Medical History:  Diagnosis Date  . Allergy   . Arthritis   . Back pain   . Bipolar 1 disorder (HCC)   . Blood transfusion without reported diagnosis   . COPD (chronic obstructive pulmonary disease) (HCC)   . Depression   . Hepatitis C   . Hyperlipidemia   . Hypertension   .  Opiate use    long term use of opiate analgesics.  . Seizures [facility])     Patient Active Problem List   Diagnosis Date Noted  . Airway hyperreactivity 12/11/2015  . Moderate COPD (chronic obstructive pulmonary disease) (HCC) 12/11/2015  . Clinical depression 12/11/2015  . Need for vaccination 12/11/2015  . HCV (hepatitis C virus) 12/11/2015  . Hepatitis C, chronic (HCC) 12/11/2015  . 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 Leukocytosis 12/10/2015  . DJD (degenerative joint disease) of knee 09/27/2015  . Bipolar 1 disorder, depressed (HCC) 05/20/2015  . Recurrent major depression in partial remission (HCC) 05/20/2015  . 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 PTSD (post-traumatic stress disorder) 10/19/2014  . 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 Essential hypertension 08/21/2014  . Degeneration of intervertebral disc of lumbar region 07/20/2014  . Sacroiliac joint disease 07/20/2014  . Bilateral occipital neuralgia 07/20/2014  . Facet syndrome, lumbar 07/20/2014  . Hyperlipidemia 03/07/2014  . Anxiety 12/05/2013  . Chronic hepatitis C without hepatic coma (HCC) 12/02/2013  . Chronic obstructive pulmonary disease (HCC) 12/02/2013  . Back pain 06/04/2013  . Polycythemia, secondary 06/04/2013  . Family history of diabetes mellitus 05/12/2013  . Low TSH level 05/12/2013  . Hypoxia 01/31/2013  . Chronic sinusitis 12/05/2012  . Middle ear effusion 12/05/2012  . Migraine 10/20/2012  . Joint pain 09/17/2012  . Asthma, extrinsic 07/12/2012  . Rhinitis, allergic 07/12/2012  .  Hypomagnesemia 07/09/2012  . Poor compliance 07/05/2012  . Depression 04/23/2012  . Bipolar 1 disorder (HCC) 02/11/2012  . 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 Chronic back pain 02/11/2012  . COPD (chronic obstructive pulmonary disease) (HCC) 02/11/2012  . Hepatitis C 02/11/2012  . Long term current use of  opiate analgesic 02/11/2012  . Memory loss 02/11/2012  . Seizure disorder (HCC) 02/11/2012  . Referral of patient 02/11/2012  . Tobacco use 02/11/2012  . Traumatic brain injury (HCC) 12/16/2011  . Bipolar affective disorder, depressed (HCC) 11/29/2008  . Hepatitis C carrier (HCC) 11/29/2008  . Hypercholesterolemia 11/29/2008  . Hypertension, benign 11/29/2008  . Personal history of tobacco use, presenting hazards to health 11/29/2008  . Do not give narcotics 08/17/2008  . Pain medication agreement broken 08/17/2008    Past Surgical History:  Procedure Laterality Date  . ABDOMINAL HYSTERECTOMY       OB History   None      Home Medications    Prior to Admission medications   Medication Sig Start Date End Date Taking? Authorizing Provider  albuterol (PROVENTIL HFA;VENTOLIN HFA) 108 (90 BASE) MCG/ACT inhaler Inhale 2 puffs into the lungs every 6 (six) hours as needed for wheezing or shortness of breath.    [provider]  cetirizine (ZYRTEC) 10 MG tablet Take 10 mg by mouth daily.    [provider]  diclofenac sodium (VOLTAREN) 1 % GEL Apply 2-4 g to painful area of the knee 4 times per day if tolerated. Patient with strain, sprain, and spasms of the knee 09/27/15   Ewing Schlein, MD  EPINEPHrine 0.3 mg/0.3 mL IJ SOAJ injection Inject 0.3 mg into the muscle once as needed (for severe allergic reaction).     [provider]  FLUoxetine (PROZAC) 20 MG capsule Take 20 mg by mouth daily.    [provider]  fluticasone (FLONASE) 50 MCG/ACT nasal spray Place 2 sprays into both nostrils daily.    [provider]  Fluticasone-Salmeterol (ADVAIR) 250-50 MCG/DOSE AEPB Inhale 1 puff into the lungs every 12 (twelve) hours.    [provider]  gabapentin (NEURONTIN) 300 MG capsule Take 300 mg by mouth 3 (three) times daily.    [provider]  hydrochlorothiazide (HYDRODIURIL) 12.5 MG tablet Take 6.25 mg by mouth daily. Reported  on 09/27/2015    [provider]  ipratropium-albuterol (DUONEB) 0.5-2.5 (3) MG/3ML SOLN Take 3 mLs by nebulization every 4 (four) hours as needed (for shortness of breath).     [provider]  levETIRAcetam (KEPPRA) 500 MG tablet Take 500 mg by mouth 2 (two) times daily.    [provider]  nicotine (NICODERM CQ - DOSED IN MG/24 HOURS) 21 mg/24hr patch Place 21 mg onto the skin daily. Reported on 09/27/2015    [provider]  simvastatin (ZOCOR) 40 MG tablet Take 40 mg by mouth at bedtime.     [provider]  tiZANidine (ZANAFLEX) 2 MG tablet Take 2 mg by mouth daily.     [provider]  traZODone (DESYREL) 150 MG tablet Take 150 mg by mouth at bedtime.    [provider]    Family History Family History  Problem Relation Age of Onset  . Arthritis Mother   . Diabetes Mother   . Hyperlipidemia Mother   . Hypertension Mother   . Miscarriages / [place] Mother   . Breast cancer Neg Hx     Social History Social History   Tobacco Use  .  Smoking status: Current Every Day Smoker    Packs/day: 0.30    Years: 40.00    Pack years: 12.00    Types: Cigarettes  . Smokeless tobacco: Never Used  . Tobacco comment: 3 na day  Substance Use Topics  . Alcohol use: No    Alcohol/week: 0.0 standard drinks  . Drug use: No     Allergies   Amitriptyline; Other; Potassium chloride; Potassium-containing compounds; Sulfa antibiotics; Topical sulfur; Aspirin; Codeine; Latex; Topiramate; and Tylenol [acetaminophen]   Review of Systems Review of Systems  Constitutional: Negative for chills and fever.  Respiratory: Negative for shortness of breath.   Cardiovascular: Negative for chest pain.  Gastrointestinal: Positive for nausea and vomiting (1 episode). Negative for abdominal pain.  Musculoskeletal: Negative for neck pain and neck stiffness.  Neurological: Positive for headaches. Negative for dizziness, seizures, syncope, speech  difficulty, weakness, light-headedness and numbness.  All other systems reviewed and are negative.    Physical Exam Updated Vital Signs BP (!) 157/85   Pulse 72   Temp 98.2 F (36.8 C) (Oral)   Resp 18   SpO2 95%   Physical Exam  Constitutional: She appears well-developed and well-nourished. No distress.  HENT:  Head: Normocephalic and atraumatic.  Right Ear: Tympanic membrane is not perforated, not erythematous, not retracted and not bulging.  Left Ear: Tympanic membrane is not perforated, not erythematous, not retracted and not bulging.  Mouth/Throat: Uvula is midline and oropharynx is clear and moist. No oropharyngeal exudate or posterior oropharyngeal erythema.  Eyes: Pupils are equal, round, and reactive to light. Conjunctivae and EOM are normal. Right eye exhibits no discharge. Left eye exhibits no discharge.  No proptosis.   Neck: Normal range of motion. Neck supple. No neck rigidity. No edema, no erythema and normal range of motion present.  Cardiovascular: Normal rate and regular rhythm.  No murmur heard. Pulmonary/Chest: Breath sounds normal. No respiratory distress. She has no wheezes. She has no rales.  Abdominal: Soft. She exhibits no distension. There is no tenderness.  Lymphadenopathy:    She has no cervical adenopathy.  Neurological: She is alert.  Alert. Clear speech. No facial droop. CNIII-XII grossly intact. Bilateral upper and lower extremities' sensation grossly intact. 5/5 symmetric strength with grip strength and with plantar and dorsi flexion bilaterally . Normal finger to nose bilaterally. Negative pronator drift. Negative Romberg sign. Gait is steady and intact.  Skin: Skin is warm and dry. No rash noted.  Psychiatric: She has a normal mood and affect. Her behavior is normal.  Nursing note and vitals reviewed.  ED Treatments / Results  Labs (all labs ordered are listed, but only abnormal results are displayed) Labs Reviewed - No data to  display  EKG None  Radiology No results found.  Procedures Procedures (including critical care time)  Medications Ordered in ED Medications  ketorolac (TORADOL) injection 30 mg (30 mg Intramuscular Given 12/31/17 1311)  diphenhydrAMINE (BENADRYL) capsule 25 mg (25 mg Oral Given 12/31/17 1309)  metoCLOPramide (REGLAN) tablet 5 mg (5 mg Oral Given 12/31/17 1310)     Initial Impression / Assessment and Plan / ED Course  I have reviewed the triage vital signs and the nursing notes.  Pertinent labs & imaging results that were available during my care of the patient were reviewed by me and considered in my medical decision making (see chart for details).    Patient presents requesting blood pressure refill with complaint of headache. Patient is nontoxic appearing, resting comfortably, vitals WNL with exception  of elevated BP. Regarding patient's blood pressure- appears consistent with prior visit BPs, I have low suspicion for HTN emergency especially given this HA feels similar to prior migraines. Patient has hx of similar headaches, gradual onset with steady progression in severity- non concerning for [facility], ICH, ischemic CVA, dural venous sinus thrombosis, acute glaucoma, giant cell arteritis, mass, or meningitis. Pt is afebrile with no focal neuro deficits, dizziness, change in vision, proptosis, or nuchal rigidity. Patient treated for headache with migraine cocktail with improvement and is requesting discharge. I feel it is reasonable to give her a short term prescription for her hydrochlorothiazide, her labs from visit a few days prior were reviewed, mild hypokalemia at 3.4, patient allergic to kdur, will give diet recommendations for potassium rich foods and strict PCP follow up directions as this is what worked well for her when previously taking this medicine.  I discussed treatment plan, need for PCP follow-up, and return precautions with the patient. Provided opportunity for questions,  patient confirmed understanding and is in agreement with plan.   Final Clinical Impressions(s) / ED Diagnoses   Final diagnoses:  Acute nonintractable headache, unspecified headache type  Medication refill  Hypertension, unspecified type    ED Discharge Orders         Ordered    hydrochlorothiazide (HYDRODIURIL) 12.5 MG tablet  Daily     12/31/17 1341           Cherly Anderson, PA-C 12/31/17 1349    Tegeler, Canary Brim, MD 01/01/18 [phone]

## 2017-12-31 NOTE — ED Notes (Signed)
Pt feeling much better.

## 2017-12-31 NOTE — ED Triage Notes (Signed)
C/o headache- requesting bp meds to be refilled -- off meds x 6-12 months - says that her bp has been high- has an appt on the 29th for reg dr- unable to get there before then.

## 2018-01-20 ENCOUNTER — Other Ambulatory Visit: Payer: Self-pay | Admitting: Acute Care

## 2018-01-20 DIAGNOSIS — M5442 Lumbago with sciatica, left side: Principal | ICD-10-CM

## 2018-01-20 DIAGNOSIS — G8929 Other chronic pain: Secondary | ICD-10-CM

## 2018-01-28 ENCOUNTER — Telehealth: Payer: Self-pay | Admitting: Pulmonary Disease

## 2018-01-28 ENCOUNTER — Encounter: Payer: Self-pay | Admitting: Pulmonary Disease

## 2018-01-28 ENCOUNTER — Ambulatory Visit (INDEPENDENT_AMBULATORY_CARE_PROVIDER_SITE_OTHER): Payer: Medicare Other | Admitting: Pulmonary Disease

## 2018-01-28 VITALS — BP 122/68 | HR 76 | Ht 59.5 in | Wt 157.4 lb

## 2018-01-28 DIAGNOSIS — J449 Chronic obstructive pulmonary disease, unspecified: Secondary | ICD-10-CM | POA: Diagnosis not present

## 2018-01-28 DIAGNOSIS — Z87891 Personal history of nicotine dependence: Secondary | ICD-10-CM

## 2018-01-28 DIAGNOSIS — J302 Other seasonal allergic rhinitis: Secondary | ICD-10-CM | POA: Diagnosis not present

## 2018-01-28 DIAGNOSIS — R062 Wheezing: Secondary | ICD-10-CM | POA: Diagnosis not present

## 2018-01-28 DIAGNOSIS — Z720 Tobacco use: Secondary | ICD-10-CM | POA: Diagnosis not present

## 2018-01-28 LAB — CBC WITH DIFFERENTIAL/PLATELET
Basophils Absolute: 0.2 10*3/uL — ABNORMAL HIGH (ref 0.0–0.1)
Basophils Relative: 1.2 % (ref 0.0–3.0)
Eosinophils Absolute: 0.2 10*3/uL (ref 0.0–0.7)
Eosinophils Relative: 1.7 % (ref 0.0–5.0)
HCT: 45.2 % (ref 36.0–46.0)
Hemoglobin: 15.3 g/dL — ABNORMAL HIGH (ref 12.0–15.0)
Lymphocytes Relative: 37.5 % (ref 12.0–46.0)
Lymphs Abs: 4.7 10*3/uL — ABNORMAL HIGH (ref 0.7–4.0)
MCHC: 33.9 g/dL (ref 30.0–36.0)
MCV: 92.1 fl (ref 78.0–100.0)
Monocytes Absolute: 0.8 10*3/uL (ref 0.1–1.0)
Monocytes Relative: 6.3 % (ref 3.0–12.0)
Neutro Abs: 6.7 10*3/uL (ref 1.4–7.7)
Neutrophils Relative %: 53.3 % (ref 43.0–77.0)
Platelets: 285 10*3/uL (ref 150.0–400.0)
RBC: 4.91 Mil/uL (ref 3.87–5.11)
RDW: 13.1 % (ref 11.5–15.5)
WBC: 12.5 10*3/uL — ABNORMAL HIGH (ref 4.0–10.5)

## 2018-01-28 MED ORDER — FLUTICASONE FUROATE-VILANTEROL 100-25 MCG/INH IN AEPB: 1.0000 | INHALATION_SPRAY | Freq: Every day | RESPIRATORY_TRACT | 0 refills | Status: AC

## 2018-01-28 NOTE — Addendum Note (Signed)
Addended by: Kerin RansomBLACKWELL, Hannan Hutmacher on: 01/28/2018 12:08 PM   Modules accepted: Orders

## 2018-01-28 NOTE — Progress Notes (Signed)
Synopsis: Referred in November 2019 for COPD by Rayetta Humphrey, MD  Subjective:   PATIENT ID: Sherri Foster GENDER: female DOB: 1960/07/05, MRN: [id number]  Chief Complaint  Patient presents with  . Consult    States she uses 2L of oxygen at night only. States she has had copd for years.     PMH of COPD she states she was dx in 1999 at [facility]. She saw a pulmonologist there once and hadn't been seen since. Currently managed with advair and albuterol. She also has allergies and has been treated for this as well. She use to follow with a [facility] allergist. She use to take allergy shots and they were stopped 2 years ago. She was a former smoker, quit November 18th of this week. She smoked at her heavist 2ppd, over the past year has been 1-2 times per week.  Currently her primary care is with [facility] in [place].  She has recently moved to the [place] area.  She needs oxygen referrals for nighttime O2 2 L.  She is been on this for several years.  I do not have any records of her prior ONO.  She may need to be requalified.   Past Medical History:  Diagnosis Date  . Allergy   . Arthritis   . Back pain   . Bipolar 1 disorder (HCC)   . Blood transfusion without reported diagnosis   . COPD (chronic obstructive pulmonary disease) (HCC)   . Depression   . Hepatitis C   . Hyperlipidemia   . Hypertension   . Opiate use    long term use of opiate analgesics.  . Seizures (HCC)      Family History  Problem Relation Age of Onset  . Arthritis Mother   . Diabetes Mother   . Hyperlipidemia Mother   . Hypertension Mother   . Miscarriages / [place] Mother   . Breast cancer Neg Hx      Past Surgical History:  Procedure Laterality Date  . ABDOMINAL HYSTERECTOMY      Social History   Socioeconomic History  . Marital status: Divorced    Spouse name: Not on file  . Number of children: Not on file  . Years of education: Not on file  . Highest education level: Not on file    Occupational History  . Not on file  Social Needs  . Financial resource strain: Not on file  . Food insecurity:    Worry: Not on file    Inability: Not on file  . Transportation needs:    Medical: Not on file    Non-medical: Not on file  Tobacco Use  . Smoking status: Current Every Day Smoker    Packs/day: 0.30    Years: 40.00    Pack years: 12.00    Types: Cigarettes  . Smokeless tobacco: Never Used  . Tobacco comment: quit 11.21.19  Substance and Sexual Activity  . Alcohol use: No    Alcohol/week: 0.0 standard drinks  . Drug use: No  . Sexual activity: Never  Lifestyle  . Physical activity:    Days per week: Not on file    Minutes per session: Not on file  . Stress: Not on file  Relationships  . Social connections:    Talks on phone: Not on file    Gets together: Not on file    Attends religious service: Not on file    Active member of club or organization: Not on file  Attends meetings of clubs or organizations: Not on file    Relationship status: Not on file  . Intimate partner violence:    Fear of current or ex partner: Not on file    Emotionally abused: Not on file    Physically abused: Not on file    Forced sexual activity: Not on file  Other Topics Concern  . Not on file  Social History Narrative  . Not on file     Allergies  Allergen Reactions  . Amitriptyline Anaphylaxis  . Other Shortness Of Breath and Itching    ragweed  . Potassium Chloride Shortness Of Breath  . Potassium-Containing Compounds Shortness Of Breath  . Sulfa Antibiotics Other (See Comments)  . Topical Sulfur Other (See Comments)    Reaction:  Unknown   . Aspirin Rash and Other (See Comments)    Breaks mouth out Other reaction(s): OTHER all over my body  . Codeine Rash    codeine with tylenol  . Latex Rash    Other reaction(s): UNKNOWN Other reaction(s): UNKNOWN  . Topiramate Nausea And Vomiting  . Tylenol [Acetaminophen] Rash     Outpatient Medications Prior to Visit   Medication Sig Dispense Refill  . albuterol (PROVENTIL HFA;VENTOLIN HFA) 108 (90 BASE) MCG/ACT inhaler Inhale 2 puffs into the lungs every 6 (six) hours as needed for wheezing or shortness of breath.    . cetirizine (ZYRTEC) 10 MG tablet Take 10 mg by mouth daily.    Marland Kitchen EPINEPHrine 0.3 mg/0.3 mL IJ SOAJ injection Inject 0.3 mg into the muscle once as needed (for severe allergic reaction).     Marland Kitchen FLUoxetine (PROZAC) 20 MG capsule Take 20 mg by mouth daily.    . fluticasone (FLONASE) 50 MCG/ACT nasal spray Place 2 sprays into both nostrils daily.    . Fluticasone-Salmeterol (ADVAIR) 250-50 MCG/DOSE AEPB Inhale 1 puff into the lungs every 12 (twelve) hours.    . gabapentin (NEURONTIN) 300 MG capsule Take 300 mg by mouth 3 (three) times daily.    . hydrochlorothiazide (HYDRODIURIL) 12.5 MG tablet Take 0.5 tablets (6.25 mg total) by mouth daily. Reported on 09/27/2015 15 tablet 0  . ipratropium-albuterol (DUONEB) 0.5-2.5 (3) MG/3ML SOLN Take 3 mLs by nebulization every 4 (four) hours as needed (for shortness of breath).     . levETIRAcetam (KEPPRA) 500 MG tablet Take 500 mg by mouth 2 (two) times daily.    . simvastatin (ZOCOR) 40 MG tablet Take 40 mg by mouth at bedtime.     Marland Kitchen tiZANidine (ZANAFLEX) 2 MG tablet Take 2 mg by mouth daily.     . traZODone (DESYREL) 150 MG tablet Take 150 mg by mouth at bedtime.    . diclofenac sodium (VOLTAREN) 1 % GEL Apply 2-4 g to painful area of the knee 4 times per day if tolerated. Patient with strain, sprain, and spasms of the knee 500 g 0  . nicotine (NICODERM CQ - DOSED IN MG/24 HOURS) 21 mg/24hr patch Place 21 mg onto the skin daily. Reported on 09/27/2015     Facility-Administered Medications Prior to Visit  Medication Dose Route Frequency Provider Last Rate Last Dose  . bupivacaine (PF) (MARCAINE) 0.25 % injection 30 mL  30 mL Other Once Ewing Schlein, MD      . bupivacaine (PF) (MARCAINE) 0.25 % injection 30 mL  30 mL Other Once Ewing Schlein, MD      .  fentaNYL (SUBLIMAZE) injection 100 mcg  100 mcg Intravenous Once Ewing Schlein, MD      .  fentaNYL (SUBLIMAZE) injection 100 mcg  100 mcg Intravenous Once Ewing Schlein, MD      . lactated ringers infusion 1,000 mL  1,000 mL Intravenous Continuous Ewing Schlein, MD      . lactated ringers infusion 1,000 mL  1,000 mL Intravenous Continuous Ewing Schlein, MD      . lactated ringers infusion 1,000 mL  1,000 mL Intravenous Continuous Ewing Schlein, MD      . lactated ringers infusion 1,000 mL  1,000 mL Intravenous Continuous Ewing Schlein, MD      . lactated ringers infusion 1,000 mL  1,000 mL Intravenous Continuous Ewing Schlein, MD      . lactated ringers infusion 1,000 mL  1,000 mL Intravenous Continuous Ewing Schlein, MD 125 mL/hr at 10/29/15 0810 1,000 mL at 10/29/15 0810  . lidocaine (PF) (XYLOCAINE) 1 % injection 10 mL  10 mL Subcutaneous Once Ewing Schlein, MD      . lidocaine (PF) (XYLOCAINE) 1 % injection 10 mL  10 mL Subcutaneous Once Ewing Schlein, MD      . midazolam (VERSED) 5 MG/5ML injection 5 mg  5 mg Intravenous Once Ewing Schlein, MD      . midazolam (VERSED) 5 MG/5ML injection 5 mg  5 mg Intravenous Once Ewing Schlein, MD      . orphenadrine (NORFLEX) injection 60 mg  60 mg Intramuscular Once Ewing Schlein, MD      . orphenadrine (NORFLEX) injection 60 mg  60 mg Intramuscular Once Ewing Schlein, MD      . orphenadrine (NORFLEX) injection 60 mg  60 mg Intramuscular Once Ewing Schlein, MD      . orphenadrine (NORFLEX) injection 60 mg  60 mg Intramuscular Once Ewing Schlein, MD      . sodium chloride 0.9 % injection 20 mL  20 mL Other Once Ewing Schlein, MD      . triamcinolone acetonide (KENALOG-40) injection 40 mg  40 mg Other Once Ewing Schlein, MD      . triamcinolone acetonide (KENALOG-40) injection 40 mg  40 mg Other Once Ewing Schlein, MD        Review of Systems  Constitutional: Negative.   HENT: Negative.   Eyes: Negative.   Respiratory: Positive for  cough, sputum production, shortness of breath and wheezing.   Cardiovascular: Negative.   Gastrointestinal: Negative.   Genitourinary: Negative.   Musculoskeletal: Negative.   Skin: Negative.   Neurological: Negative.   Endo/Heme/Allergies: Negative.   Psychiatric/Behavioral: Negative.      Objective:  Physical Exam  Constitutional: She is oriented to person, place, and time. She appears well-developed and well-nourished. No distress.  Looks older than stated age  HENT:  Head: Normocephalic and atraumatic.  Mouth/Throat: Oropharynx is clear and moist.  Eyes: Pupils are equal, round, and reactive to light. Conjunctivae are normal. No scleral icterus.  Neck: Neck supple. No JVD present. No tracheal deviation present.  Cardiovascular: Normal rate, regular rhythm, normal heart sounds and intact distal pulses.  No murmur heard. Pulmonary/Chest: Effort normal and breath sounds normal. No accessory muscle usage or stridor. No tachypnea. No respiratory distress. She has no wheezes. She has no rhonchi. She has no rales.  Abdominal: Soft. Bowel sounds are normal. She exhibits no distension. There is no tenderness.  Musculoskeletal: She exhibits no edema or tenderness.  Lymphadenopathy:    She has no cervical adenopathy.  Neurological: She is alert and oriented to person, place, and time.  Skin: Skin is warm and dry. Capillary refill takes less than  2 seconds. No rash noted.  Psychiatric: She has a normal mood and affect. Her behavior is normal.  Vitals reviewed.    Vitals:   01/28/18 1114  BP: 122/68  Pulse: 76  SpO2: 96%  Weight: 157 lb 6.4 oz (71.4 kg)  Height: 4' 11.5" (1.511 m)   96% on RA BMI Readings from Last 3 Encounters:  01/28/18 31.26 kg/m  12/24/17 30.70 kg/m  05/21/17 29.37 kg/m   Wt Readings from Last 3 Encounters:  01/28/18 157 lb 6.4 oz (71.4 kg)  12/24/17 157 lb 3.2 oz (71.3 kg)  05/21/17 150 lb 6.4 oz (68.2 kg)     CBC    Component Value Date/Time     WBC 11.4 (H) 12/28/2017 1550   RBC 4.82 12/28/2017 1550   HGB 14.6 12/28/2017 1550   HGB 15.5 01/26/2014 1007   HCT 46.2 (H) 12/28/2017 1550   HCT 48.0 (H) 03/02/2014 0959   PLT 339 12/28/2017 1550   PLT 280 01/26/2014 1007   MCV 95.9 12/28/2017 1550   MCV 95 01/26/2014 1007   MCH 30.3 12/28/2017 1550   MCHC 31.6 12/28/2017 1550   RDW 12.2 12/28/2017 1550   RDW 13.4 01/26/2014 1007   LYMPHSABS 4.8 (H) 12/24/2017 1500   LYMPHSABS 3.1 01/26/2014 1007   MONOABS 1.0 12/24/2017 1500   MONOABS 0.7 01/26/2014 1007   EOSABS 0.2 12/24/2017 1500   EOSABS 0.2 01/26/2014 1007   BASOSABS 0.1 12/24/2017 1500   BASOSABS 0.1 01/26/2014 1007    Chest Imaging: None recent   Pulmonary Functions Testing Results: No flowsheet data found.  FeNO: None   Pathology: None   Echocardiogram: None   Heart Catheterization: None     Assessment & Plan:   Chronic obstructive pulmonary disease, unspecified COPD type (HCC) - Plan: Pulmonary function test, 6 minute walk, CBC w/Diff, Resp Allergy Profile Regn2DC [place] [facility], Ambulatory Referral for DME  Tobacco use  Wheezing  Seasonal allergies  Discussion:  Patient has history of allergies as well as longtime smoking.  She potentially has asthma COPD overlap syndrome.  She did have PFTs a long time ago but this has not been repeated anytime soon.  We will recommend the following: Regional allergy panel as well as IgE The CBC with differential to look for absolute eosinophil count We will complete full PFTs and 6-minute walk test. We will send DME referrals for new nebulizer machine as well as oxygen. She may need a repeat overnight oximetry to qualify for continued O2 needs if her DME supply company switches. Okay to place this order if needed. We will start patient on Breo 100 once daily and she can discontinue her current Advair as she does not routinely take the Advair twice daily anyways. Patient was also counseled on smoking cessation  and maintenance of this.  Congratulated her on the fact that she recently quit smoking on 01/25/2018.  Return to clinic in 6 weeks.  We will set up patient for appointment with lung cancer screening program.  Greater than 50% of this patient 60-minute office visit was spent face-to-face discussing the above recommendations as well as plans for lung function tests enrollment in lung cancer screening as well as blood test as described above.   Current Outpatient Medications:  .  albuterol (PROVENTIL HFA;VENTOLIN HFA) 108 (90 BASE) MCG/ACT inhaler, Inhale 2 puffs into the lungs every 6 (six) hours as needed for wheezing or shortness of breath., Disp: , Rfl:  .  cetirizine (ZYRTEC) 10 MG  tablet, Take 10 mg by mouth daily., Disp: , Rfl:  .  EPINEPHrine 0.3 mg/0.3 mL IJ SOAJ injection, Inject 0.3 mg into the muscle once as needed (for severe allergic reaction). , Disp: , Rfl:  .  FLUoxetine (PROZAC) 20 MG capsule, Take 20 mg by mouth daily., Disp: , Rfl:  .  fluticasone (FLONASE) 50 MCG/ACT nasal spray, Place 2 sprays into both nostrils daily., Disp: , Rfl:  .  Fluticasone-Salmeterol (ADVAIR) 250-50 MCG/DOSE AEPB, Inhale 1 puff into the lungs every 12 (twelve) hours., Disp: , Rfl:  .  gabapentin (NEURONTIN) 300 MG capsule, Take 300 mg by mouth 3 (three) times daily., Disp: , Rfl:  .  hydrochlorothiazide (HYDRODIURIL) 12.5 MG tablet, Take 0.5 tablets (6.25 mg total) by mouth daily. Reported on 09/27/2015, Disp: 15 tablet, Rfl: 0 .  ipratropium-albuterol (DUONEB) 0.5-2.5 (3) MG/3ML SOLN, Take 3 mLs by nebulization every 4 (four) hours as needed (for shortness of breath). , Disp: , Rfl:  .  levETIRAcetam (KEPPRA) 500 MG tablet, Take 500 mg by mouth 2 (two) times daily., Disp: , Rfl:  .  simvastatin (ZOCOR) 40 MG tablet, Take 40 mg by mouth at bedtime. , Disp: , Rfl:  .  tiZANidine (ZANAFLEX) 2 MG tablet, Take 2 mg by mouth daily. , Disp: , Rfl:  .  traZODone (DESYREL) 150 MG tablet, Take 150 mg by mouth  at bedtime., Disp: , Rfl:  .  fluticasone furoate-vilanterol (BREO ELLIPTA) 100-25 MCG/INH AEPB, Inhale 1 puff into the lungs daily., Disp: 1 each, Rfl: 0   Josephine Igo, DO  Pulmonary Critical Care 01/28/2018 11:49 AM

## 2018-01-28 NOTE — Telephone Encounter (Signed)
Order was placed to [facility] at today's OV to provide pt with additional tanks.  Per Rod HollerGilda with [employer], pt owns her current equipment, therefore pt is not set up with billing at this time.  In order for pt to be set back up with billing, pt will need ONO to qualify her. If pt qualifies for night time oxygen, an order will need to be placed to [facility] to provide pt with night time oxygen.  Dr. Tonia BroomsIcard please advise. Thanks

## 2018-01-28 NOTE — Telephone Encounter (Signed)
lmtcb x1 for pt. 

## 2018-01-28 NOTE — Addendum Note (Signed)
Addended by: Ebony CargoKING, Masiyah Engen M on: 01/28/2018 12:04 PM   Modules accepted: Orders

## 2018-01-28 NOTE — Patient Instructions (Addendum)
Thank you for visiting Dr. Tonia BroomsIcard at [facility]. Today we recommend the following:  Orders Placed This Encounter  Procedures  . CBC w/Diff  . Resp Allergy Profile Regn2DC [place] [facility]  . Ambulatory Referral for DME  . Pulmonary function test  . 6 minute walk   Meds ordered this encounter  Medications  . fluticasone furoate-vilanterol (BREO ELLIPTA) 100-25 MCG/INH AEPB    Sig: Inhale 1 puff into the lungs daily.    Dispense:  1 each    Refill:  0   Please schedule appointment with Kandice RobinsonsSarah Groce, NP for enrollment in our Lung Cancer Screening Program  Return in about 6 weeks (around 03/11/2018).

## 2018-01-28 NOTE — Telephone Encounter (Signed)
Please order ONO Thanks Josephine IgoBradley L Cosandra Plouffe, DO [place] [facility] 01/28/2018 2:43 PM

## 2018-01-28 NOTE — Addendum Note (Signed)
Addended by: Kerin RansomBLACKWELL, Roshonda Sperl on: 01/28/2018 11:58 AM   Modules accepted: Orders

## 2018-01-29 ENCOUNTER — Telehealth: Payer: Self-pay | Admitting: Pulmonary Disease

## 2018-01-29 NOTE — Telephone Encounter (Signed)
PCCM:  She smoked for 40+ years, 2ppd at her maximum? And she told me only recently over the past 2 years she cut down to a few cigarettes per week. Unless that is not true then she definitely has a >30 py/hx.   Please check into this before cancelling the referral.  Josephine IgoBradley L , DO [place] [facility] 01/29/2018 2:15 PM

## 2018-01-29 NOTE — Telephone Encounter (Signed)
FYI for Dr Tonia BroomsIcard: I spoke with pt regarding lung cancer screening.  I talked with her in detail about her smoking history.  Pt does not meet the 30 pack year requirement for lung cancer screening.  Pt is aware her referral has been cancelled.

## 2018-01-29 NOTE — Telephone Encounter (Signed)
I will call pt again and go over her smoking history in detail.  Please leave message in lung screening box until I can speak to pt.

## 2018-01-29 NOTE — Telephone Encounter (Signed)
Order has been placed for the ono to be performed. Nothing further needed.

## 2018-02-01 NOTE — Telephone Encounter (Signed)
Spoke with pt again and reviewed smoking history in detail.  PT states she started smoking at age 89 and smoke 2 ppd for 3 years then states that she quit smoking completely for 10 years then started back and only smoked approx 2 cigarettes a week until now currently. This still puts her under the 30 pack year history for lung cancer screening.  Please advise if there is anything else I need to do.

## 2018-02-01 NOTE — Telephone Encounter (Signed)
PCCM:  Great. Thank you for clarifying this.  Sherri IgoBradley L Icard, DO [place] [facility] 02/01/2018 12:34 PM

## 2018-02-03 LAB — RESPIRATORY ALLERGY PROFILE REGION II ~~LOC~~
Allergen, A. alternata, m6: <0.1 kU/L
Allergen, Cedar tree, t12: <0.1 kU/L
Allergen, D pternoyssinus,d7: <0.1 kU/L
Allergen, Oak,t7: <0.1 kU/L
Allergen, P. notatum, m1: <0.1 kU/L
Aspergillus fumigatus, m3: <0.1 kU/L
Box Elder IgE: <0.1 kU/L
CLASS: 0
CLASS: 0
CLASS: 0
CLASS: 0
CLASS: 0
CLASS: 0
CLASS: 0
CLASS: 0
CLASS: 0
CLASS: 0
Class: 0
Class: 0
Class: 0
Class: 0
Class: 0
Class: 0
Class: 0
Class: 0
Class: 0
Class: 0
Class: 0
Class: 0
Class: 0
Class: 0
Cockroach: <0.1 kU/L
Dog Dander: <0.1 kU/L
IGE (IMMUNOGLOBULIN E), SERUM: 27 kU/L (ref <=114)
Rough Pigweed  IgE: <0.1 kU/L
Sheep Sorrel IgE: <0.1 kU/L
Timothy Grass: <0.1 kU/L

## 2018-02-03 LAB — INTERPRETATION:

## 2018-02-18 ENCOUNTER — Telehealth: Payer: Self-pay | Admitting: Pulmonary Disease

## 2018-02-18 NOTE — Telephone Encounter (Signed)
Spoke with the pt  She states she already has o2 and uses 2lpm with sleep  She does need to have a new order put in for recert  Should we stick with the 2 lpm that she has been on, or order the 1.5 and repeat ONO?  Please advise thanks!

## 2018-02-18 NOTE — Telephone Encounter (Signed)
Pt is calling back [phone]

## 2018-02-18 NOTE — Telephone Encounter (Signed)
Called patient, unable to reach left message to give us a call back. 

## 2018-02-18 NOTE — Telephone Encounter (Signed)
Was she wearing oxygen during the ONO? No she can continue 2L

## 2018-02-18 NOTE — Telephone Encounter (Signed)
ONO on RA done on 02/16/18 by [facility] received  Dr Tonia BroomsIcard not in the office today and Shaune LeeksQgenda does not show where he is scheduled  ONO shows that pt's sats dropped to 88% or below for 43 min  [facility] is app of the morning and will take her the results to review and advise if we can order o2  Thanks!

## 2018-02-18 NOTE — Telephone Encounter (Signed)
Yes please order nocturnal oxygen 1.5L Repeat ONO on oxygen once received

## 2018-02-18 NOTE — Telephone Encounter (Signed)
LMTCB for the pt and will then order the o2

## 2018-02-19 NOTE — Telephone Encounter (Signed)
LMTCB

## 2018-02-22 NOTE — Telephone Encounter (Signed)
Attempted to contact pt. I did not receive an answer. I have left a message for pt to return our call.  

## 2018-02-23 NOTE — Telephone Encounter (Signed)
We have attempted to contact pt several times with no success or call back from pt. Per triage protocol, message will be closed.  

## 2018-03-17 ENCOUNTER — Ambulatory Visit: Payer: Medicare Other | Admitting: Pulmonary Disease

## 2018-03-19 ENCOUNTER — Ambulatory Visit: Payer: Medicare Other | Admitting: Pulmonary Disease

## 2018-03-19 ENCOUNTER — Telehealth: Payer: Self-pay

## 2018-03-19 NOTE — Telephone Encounter (Signed)
Please schedule patient for OV and PFT.   Call made to patient, made patient aware we were asking if she would be open to seeing a NP instead of BI as he has a bronch this afternoon and will need to get out of the office early. Patient states she was going to call and reschedule due to transportation problems. Patient states she will call back to reschedule. Nothing further is needed at this time.

## 2018-10-14 ENCOUNTER — Other Ambulatory Visit: Payer: Self-pay | Admitting: Family Medicine

## 2018-10-14 DIAGNOSIS — Z1231 Encounter for screening mammogram for malignant neoplasm of breast: Secondary | ICD-10-CM

## 2018-11-12 NOTE — Progress Notes (Deleted)
[facility]  Telephone:([phone] [phone]) [phone]  ID: Sherri Foster OB: [birth year]  MR#: [id number]  QIO#:[id number]#:[id number]  Patient Care Team: Rayetta HumphreyGeorge, Sionne A, MD as PCP - General (Family Medicine)  CHIEF COMPLAINT: Leukocytosis, unspecified; polycythemia, secondary  INTERVAL HISTORY: Patient returns to clinic today for repeat laboratory work and further evaluation.  She currently feels well and is asymptomatic.  She has no neurologic complaints.  She denies any recent fevers or illnesses. She has a good appetite and denies weight loss. She denies any chest pain or shortness of breath. She has no nausea, vomiting, constipation, or diarrhea.  She denies any melena or hematochezia.  She has no urinary complaints.  Patient feels at her baseline offers no specific complaints today.  REVIEW OF SYSTEMS:   Review of Systems  Constitutional: Negative.  Negative for fever, malaise/fatigue and weight loss.  Respiratory: Negative.  Negative for cough and shortness of breath.   Cardiovascular: Negative.  Negative for chest pain and leg swelling.  Gastrointestinal: Negative.  Negative for abdominal pain, blood in stool and melena.  Genitourinary: Negative.  Negative for dysuria and hematuria.  Musculoskeletal: Negative.  Negative for back pain.  Skin: Negative.  Negative for rash.  Neurological: Negative.  Negative for sensory change, focal weakness and weakness.  Psychiatric/Behavioral: Negative.  The patient is not nervous/anxious.     As per HPI. Otherwise, a complete review of systems is negative.  PAST MEDICAL HISTORY: Past Medical History:  Diagnosis Date   Allergy    Arthritis    Back pain    Bipolar 1 disorder (HCC)    Blood transfusion without reported diagnosis    COPD (chronic obstructive pulmonary disease) (HCC)    Depression    Hepatitis C    Hyperlipidemia    Hypertension    Opiate use    long term use of opiate analgesics.   Seizures  (HCC)     PAST SURGICAL HISTORY: Past Surgical History:  Procedure Laterality Date   ABDOMINAL HYSTERECTOMY      FAMILY HISTORY: Family History  Problem Relation Age of Onset   Arthritis Mother    Diabetes Mother    Hyperlipidemia Mother    Hypertension Mother    Miscarriages / [place] Mother    Breast cancer Neg Hx     ADVANCED DIRECTIVES (Y/N):  N  HEALTH MAINTENANCE: Social History   Tobacco Use   Smoking status: Current Every Day Smoker    Packs/day: 0.30    Years: 40.00    Pack years: 12.00    Types: Cigarettes   Smokeless tobacco: Never Used   Tobacco comment: quit 11.21.19  Substance Use Topics   Alcohol use: No    Alcohol/week: 0.0 standard drinks   Drug use: No     Colonoscopy:  PAP:  Bone density:  Lipid panel:  Allergies  Allergen Reactions   Amitriptyline Anaphylaxis   Other Shortness Of Breath and Itching    ragweed   Potassium Chloride Shortness Of Breath   Potassium-Containing Compounds Shortness Of Breath   Sulfa Antibiotics Other (See Comments)   Topical Sulfur Other (See Comments)    Reaction:  Unknown    Aspirin Rash and Other (See Comments)    Breaks mouth out Other reaction(s): OTHER all over my body   Codeine Rash    codeine with tylenol   Latex Rash    Other reaction(s): UNKNOWN Other reaction(s): UNKNOWN   Topiramate Nausea And Vomiting   Tylenol [Acetaminophen] Rash  Current Outpatient Medications  Medication Sig Dispense Refill   albuterol (PROVENTIL HFA;VENTOLIN HFA) 108 (90 BASE) MCG/ACT inhaler Inhale 2 puffs into the lungs every 6 (six) hours as needed for wheezing or shortness of breath.     cetirizine (ZYRTEC) 10 MG tablet Take 10 mg by mouth daily.     EPINEPHrine 0.3 mg/0.3 mL IJ SOAJ injection Inject 0.3 mg into the muscle once as needed (for severe allergic reaction).      FLUoxetine (PROZAC) 20 MG capsule Take 20 mg by mouth daily.     fluticasone (FLONASE) 50 MCG/ACT nasal  spray Place 2 sprays into both nostrils daily.     fluticasone furoate-vilanterol (BREO ELLIPTA) 100-25 MCG/INH AEPB Inhale 1 puff into the lungs daily. 1 each 0   Fluticasone-Salmeterol (ADVAIR) 250-50 MCG/DOSE AEPB Inhale 1 puff into the lungs every 12 (twelve) hours.     gabapentin (NEURONTIN) 300 MG capsule Take 300 mg by mouth 3 (three) times daily.     hydrochlorothiazide (HYDRODIURIL) 12.5 MG tablet Take 0.5 tablets (6.25 mg total) by mouth daily. Reported on 09/27/2015 15 tablet 0   ipratropium-albuterol (DUONEB) 0.5-2.5 (3) MG/3ML SOLN Take 3 mLs by nebulization every 4 (four) hours as needed (for shortness of breath).      levETIRAcetam (KEPPRA) 500 MG tablet Take 500 mg by mouth 2 (two) times daily.     simvastatin (ZOCOR) 40 MG tablet Take 40 mg by mouth at bedtime.      tiZANidine (ZANAFLEX) 2 MG tablet Take 2 mg by mouth daily.      traZODone (DESYREL) 150 MG tablet Take 150 mg by mouth at bedtime.     No current facility-administered medications for this visit.     OBJECTIVE: There were no vitals filed for this visit.   There is no height or weight on file to calculate BMI.    ECOG FS:0 - Asymptomatic  General: Well-developed, well-nourished, no acute distress. Eyes: Pink conjunctiva, anicteric sclera. HEENT: Normocephalic, moist mucous membranes, clear oropharnyx. Lungs: Clear to auscultation bilaterally. Heart: Regular rate and rhythm. No rubs, murmurs, or gallops. Abdomen: Soft, nontender, nondistended. No organomegaly noted, normoactive bowel sounds. Musculoskeletal: No edema, cyanosis, or clubbing. Neuro: Alert, answering all questions appropriately. Cranial nerves grossly intact. Skin: No rashes or petechiae noted. Psych: Normal affect.  LAB RESULTS:  Lab Results  Component Value Date   NA 138 12/28/2017   K 3.4 (L) 12/28/2017   CL 105 12/28/2017   CO2 26 12/28/2017   GLUCOSE 111 (H) 12/28/2017   BUN 6 12/28/2017   CREATININE 0.82 12/28/2017    CALCIUM 8.8 (L) 12/28/2017   PROT 7.3 06/09/2015   ALBUMIN 4.0 06/09/2015   AST 20 06/09/2015   ALT 14 06/09/2015   ALKPHOS 85 06/09/2015   BILITOT 0.6 06/09/2015   GFRNONAA >60 12/28/2017   GFRAA >60 12/28/2017    Lab Results  Component Value Date   WBC 12.5 (H) 01/28/2018   NEUTROABS 6.7 01/28/2018   HGB 15.3 (H) 01/28/2018   HCT 45.2 01/28/2018   MCV 92.1 01/28/2018   PLT 285.0 01/28/2018     STUDIES: No results found.  ASSESSMENT: Leukocytosis, unspecified; polycythemia, secondary.   PLAN:    1. Leukocytosis, unspecified: Patient's white blood cell count remains slightly elevated at 12.3 today, but is approximately her baseline.  Her count has been persistently elevated for the past several years ranging between 12.0 and 19.3. Previously, all of her other laboratory work including flow cytometry was reported as negative.  BCR-ABL was drawn today for completeness.  No intervention is needed at this time.  After discussion with the patient, it was agreed upon that no further follow-up is necessary.  Please monitor her CBC 1-2 times per year and if there is any questions or concerns please refer patient back for further evaluation.   2. Polycythemia, secondary: Resolved.  Likely secondary to heavy tobacco use which patient is decreasing.  Patient reports she was receiving phlebotomy in the past, but we do not have documentation of this. Her hemoglobin has improved since decreasing her tobacco use.  JAK-2 mutation, iron stores, and erythropoietin levels are all negative or within normal limits.  3. Hypertension: Chronic and unchanged.  Patient's blood pressure remains persistently elevated.  Continue monitoring and treatment per primary care.  Patient expressed understanding and was in agreement with this plan. She also understands that She can call clinic at any time with any questions, concerns, or complaints.    Jeralyn Ruths, MD   11/12/2018 4:38 PM

## 2018-11-22 ENCOUNTER — Inpatient Hospital Stay: Payer: Medicare Other | Admitting: Oncology

## 2018-12-21 ENCOUNTER — Ambulatory Visit
Admission: RE | Admit: 2018-12-21 | Discharge: 2018-12-21 | Disposition: A | Discharge status: Home / Self Care | Payer: Medicare Other | Source: Ambulatory Visit | Attending: Family Medicine | Admitting: Family Medicine

## 2018-12-21 DIAGNOSIS — Z1231 Encounter for screening mammogram for malignant neoplasm of breast: Secondary | ICD-10-CM | POA: Diagnosis not present

## 2019-11-09 ENCOUNTER — Other Ambulatory Visit: Payer: Self-pay | Admitting: Family Medicine

## 2019-11-09 DIAGNOSIS — Z1231 Encounter for screening mammogram for malignant neoplasm of breast: Secondary | ICD-10-CM

## 2020-03-02 ENCOUNTER — Emergency Department
Admission: EM | Admit: 2020-03-02 | Discharge: 2020-03-02 | Disposition: A | Discharge status: Home / Self Care | Payer: Medicare Other | Attending: Emergency Medicine | Admitting: Emergency Medicine

## 2020-03-02 ENCOUNTER — Other Ambulatory Visit: Payer: Self-pay

## 2020-03-02 DIAGNOSIS — Z91040 Latex allergy status: Secondary | ICD-10-CM | POA: Insufficient documentation

## 2020-03-02 DIAGNOSIS — F17210 Nicotine dependence, cigarettes, uncomplicated: Secondary | ICD-10-CM | POA: Insufficient documentation

## 2020-03-02 DIAGNOSIS — W268XXA Contact with other sharp object(s), not elsewhere classified, initial encounter: Secondary | ICD-10-CM | POA: Diagnosis not present

## 2020-03-02 DIAGNOSIS — Y92009 Unspecified place in unspecified non-institutional (private) residence as the place of occurrence of the external cause: Secondary | ICD-10-CM | POA: Diagnosis not present

## 2020-03-02 DIAGNOSIS — S6991XA Unspecified injury of right wrist, hand and finger(s), initial encounter: Secondary | ICD-10-CM | POA: Diagnosis present

## 2020-03-02 DIAGNOSIS — Y93G1 Activity, food preparation and clean up: Secondary | ICD-10-CM | POA: Insufficient documentation

## 2020-03-02 DIAGNOSIS — I10 Essential (primary) hypertension: Secondary | ICD-10-CM | POA: Diagnosis not present

## 2020-03-02 DIAGNOSIS — Z79899 Other long term (current) drug therapy: Secondary | ICD-10-CM | POA: Diagnosis not present

## 2020-03-02 DIAGNOSIS — J449 Chronic obstructive pulmonary disease, unspecified: Secondary | ICD-10-CM | POA: Insufficient documentation

## 2020-03-02 DIAGNOSIS — S61011A Laceration without foreign body of right thumb without damage to nail, initial encounter: Secondary | ICD-10-CM | POA: Insufficient documentation

## 2020-03-02 MED ORDER — LIDOCAINE HCL (PF) 1 % IJ SOLN
5.0000 mL | Freq: Once | INTRAMUSCULAR | Status: AC
  Administered 2020-03-02: 5 mL
  Filled 2020-03-02: qty 5

## 2020-03-02 NOTE — ED Triage Notes (Signed)
Pt comes into the ED from home, states she nicked her right thumb on the a grater and cant seem to get it to stop bleeding pressure bandage applied to the wound on arrival. Controlled at this time

## 2020-03-02 NOTE — Discharge Instructions (Addendum)
Follow-up with your regular doctor for suture removal in 1 week.  Return emergency department if any sign of infection.  Keep the area as dry as possible.

## 2020-03-02 NOTE — ED Notes (Signed)
NAD noted at time of D/C. Pt denies questions or concerns. Pt ambulatory to the lobby at this time.  

## 2020-03-02 NOTE — ED Provider Notes (Signed)
[facility] Emergency Department Provider Note  ____________________________________________   Event Date/Time   First MD Initiated Contact with Patient 03/02/20 1725     (approximate)  I have reviewed the triage vital signs and the nursing notes.   HISTORY  Chief Complaint Laceration    HPI Sherri Foster is a 59 y.o. female presents emergency department with a laceration to the right thumb on a grader.  States she was trying to make Christmas dinner when she cut her hand.  Bleeding will not stop.  Tetanus is up-to-date.    Past Medical History:  Diagnosis Date   Allergy    Arthritis    Back pain    Bipolar 1 disorder (HCC)    Blood transfusion without reported diagnosis    COPD (chronic obstructive pulmonary disease) (HCC)    Depression    Hepatitis C    Hyperlipidemia    Hypertension    Opiate use    long term use of opiate analgesics.   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) 12/11/2015   Leukocytosis 12/10/2015   DJD (degenerative joint disease) of knee 09/27/2015   Bipolar 1 disorder, depressed (HCC) 05/20/2015   Recurrent major depression in partial remission (HCC) 05/20/2015   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/18/2015   PTSD (post-traumatic stress disorder) 10/19/2014   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   Essential hypertension 08/21/2014   Degeneration of intervertebral disc of lumbar region 07/20/2014   Sacroiliac joint disease 07/20/2014   Bilateral occipital neuralgia 07/20/2014   Facet syndrome, lumbar  07/20/2014   Hyperlipidemia 03/07/2014   Anxiety 12/05/2013   Chronic hepatitis C without hepatic coma (HCC) 12/02/2013   Chronic obstructive pulmonary disease (HCC) 12/02/2013   Back pain 06/04/2013   Polycythemia, secondary 06/04/2013   Family history of diabetes mellitus 05/12/2013   Low TSH level 05/12/2013   Hypoxia 01/31/2013   Chronic sinusitis 12/05/2012   Middle ear effusion 12/05/2012   Migraine 10/20/2012   Joint pain 09/17/2012   Asthma, extrinsic 07/12/2012   Rhinitis, allergic 07/12/2012   Hypomagnesemia 07/09/2012   Poor compliance 07/05/2012   Depression 04/23/2012   Bipolar 1 disorder (HCC) 02/11/2012   Mental retardation, 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   Chronic back pain 02/11/2012   COPD (chronic obstructive pulmonary disease) (HCC) 02/11/2012   Hepatitis C 02/11/2012   Long term current use of opiate analgesic 02/11/2012   Memory loss 02/11/2012   Seizure disorder (HCC) 02/11/2012   Referral of patient 02/11/2012   Tobacco use 02/11/2012   Traumatic brain injury (HCC) 12/16/2011   Bipolar affective disorder, depressed (HCC) 11/29/2008   Hepatitis C carrier (HCC) 11/29/2008   Hypercholesterolemia 11/29/2008   Hypertension, benign 11/29/2008   Personal history of tobacco use, presenting hazards to health 11/29/2008   Do not give narcotics 08/17/2008   Pain medication agreement broken 08/17/2008    Past Surgical History:  Procedure Laterality Date   ABDOMINAL HYSTERECTOMY      Prior to Admission medications   Medication Sig Start Date End Date Taking? Authorizing Provider  albuterol (PROVENTIL HFA;VENTOLIN HFA) 108 (90 BASE) MCG/ACT inhaler Inhale 2 puffs into  the lungs every 6 (six) hours as needed for wheezing or shortness of breath.    [provider]  cetirizine (ZYRTEC) 10 MG tablet Take 10 mg by mouth daily.    [provider]  EPINEPHrine 0.3 mg/0.3 mL IJ SOAJ injection Inject 0.3 mg  into the muscle once as needed (for severe allergic reaction).     [provider]  FLUoxetine (PROZAC) 20 MG capsule Take 20 mg by mouth daily.    [provider]  fluticasone (FLONASE) 50 MCG/ACT nasal spray Place 2 sprays into both nostrils daily.    [provider]  fluticasone furoate-vilanterol (BREO ELLIPTA) 100-25 MCG/INH AEPB Inhale 1 puff into the lungs daily. 01/28/18   Icard, Rachel BoBradley L, DO  Fluticasone-Salmeterol (ADVAIR) 250-50 MCG/DOSE AEPB Inhale 1 puff into the lungs every 12 (twelve) hours.    [provider]  gabapentin (NEURONTIN) 300 MG capsule Take 300 mg by mouth 3 (three) times daily.    [provider]  hydrochlorothiazide (HYDRODIURIL) 12.5 MG tablet Take 0.5 tablets (6.25 mg total) by mouth daily. Reported on 09/27/2015 12/31/17   Petrucelli, Samantha R, PA-C  ipratropium-albuterol (DUONEB) 0.5-2.5 (3) MG/3ML SOLN Take 3 mLs by nebulization every 4 (four) hours as needed (for shortness of breath).     [provider]  levETIRAcetam (KEPPRA) 500 MG tablet Take 500 mg by mouth 2 (two) times daily.    [provider]  simvastatin (ZOCOR) 40 MG tablet Take 40 mg by mouth at bedtime.     [provider]  tiZANidine (ZANAFLEX) 2 MG tablet Take 2 mg by mouth daily.     [provider]  traZODone (DESYREL) 150 MG tablet Take 150 mg by mouth at bedtime.    [provider]    Allergies Amitriptyline, Other, Potassium chloride, Potassium-containing compounds, Sulfa antibiotics, Topical sulfur, Aspirin, Codeine, Latex, Topiramate, and Tylenol [acetaminophen]  Family History  Problem Relation Age of Onset   Arthritis Mother    Diabetes Mother    Hyperlipidemia Mother    Hypertension Mother    Miscarriages / [place] Mother    Breast cancer Neg Hx     Social History Social History   Tobacco Use   Smoking status: Current Every Day Smoker    Packs/day: 0.30    Years: 40.00     Pack years: 12.00    Types: Cigarettes   Smokeless tobacco: Never Used   Tobacco comment: quit 11.21.19  Substance Use Topics   Alcohol use: No    Alcohol/week: 0.0 standard drinks   Drug use: No    Review of Systems  Constitutional: No fever/chills Eyes: No visual changes. ENT: No sore throat. Respiratory: Denies cough Genitourinary: Negative for dysuria. Musculoskeletal: Negative for back pain.  Positive for laceration to the right thumb Skin: Negative for rash. Psychiatric: no mood changes,     ____________________________________________   PHYSICAL EXAM:  VITAL SIGNS: ED Triage Vitals  Enc Vitals Group     BP 03/02/20 1630 (!) 144/90     Pulse Rate 03/02/20 1630 (!) 104     Resp 03/02/20 1630 18     Temp 03/02/20 1630 97.6 F (36.4 C)     Temp Source 03/02/20 1630 Oral     SpO2 03/02/20 1630 95 %     Weight 03/02/20 1631 157 lb (71.2 kg)     Height 03/02/20 1631 5' (1.524 m)     Head Circumference --      Peak Flow --  Pain Score 03/02/20 1623 0     Pain Loc --      Pain Edu? --      Excl. in GC? --     Constitutional: Alert and oriented. Well appearing and in no acute distress. Eyes: Conjunctivae are normal.  Head: Atraumatic. Nose: No congestion/rhinnorhea. Mouth/Throat: Mucous membranes are moist.   Neck:  supple no lymphadenopathy noted Cardiovascular: Normal rate, regular rhythm.  Respiratory: Normal respiratory effort.  No retractions GU: deferred Musculoskeletal: FROM all extremities, warm and well perfused, U-shaped laceration noted to the dorsum of the right thumb, no foreign body, active bleeding noted, neurovascular intact Neurologic:  Normal speech and language.  Skin:  Skin is warm, dry No rash noted. Psychiatric: Mood and affect are normal. Speech and behavior are normal.  ____________________________________________   LABS (all labs ordered are listed, but only abnormal results are displayed)  Labs Reviewed - No data to  display ____________________________________________   ____________________________________________  RADIOLOGY    ____________________________________________   PROCEDURES  Procedure(s) performed:   Marland KitchenMarland KitchenLaceration Repair  Date/Time: 03/02/2020 6:39 PM Performed by: Faythe Ghee, PA-C Authorized by: Faythe Ghee, PA-C   Consent:    Consent obtained:  Verbal   Consent given by:  Patient   Risks, benefits, and alternatives were discussed: yes     Risks discussed:  Infection, pain, retained foreign body, tendon damage, vascular damage, poor wound healing and nerve damage Universal protocol:    Procedure explained and questions answered to patient or proxy's satisfaction: yes     Patient identity confirmed:  Verbally with patient Anesthesia:    Anesthesia method:  Nerve block   Block needle gauge:  27 G   Block anesthetic:  Lidocaine 1% w/o epi   Block injection procedure:  Anatomic landmarks identified, introduced needle, incremental injection, anatomic landmarks palpated and negative aspiration for blood   Block outcome:  Anesthesia achieved Laceration details:    Location:  Finger   Finger location:  R thumb   Length (cm):  2 Pre-procedure details:    Preparation:  Patient was prepped and draped in usual sterile fashion Exploration:    Hemostasis achieved with:  Direct pressure   Imaging outcome: foreign body not noted     Wound exploration: wound explored through full range of motion     Wound extent: no foreign bodies/material noted, no muscle damage noted, no tendon damage noted, no underlying fracture noted and no vascular damage noted   Treatment:    Area cleansed with:  Povidone-iodine   Amount of cleaning:  Standard   Irrigation solution:  Sterile saline   Irrigation method:  Syringe and tap   Debridement:  None   Undermining:  None   Scar revision: no   Skin repair:    Repair method:  Sutures   Suture size:  5-0   Suture material:  Nylon   Suture  technique:  Simple interrupted   Number of sutures:  5 Approximation:    Approximation:  Close Repair type:    Repair type:  Simple Post-procedure details:    Dressing:  Non-adherent dressing   Procedure completion:  Tolerated well, no immediate complications      ____________________________________________   INITIAL IMPRESSION / ASSESSMENT AND PLAN / ED COURSE  Pertinent labs & imaging results that were available during my care of the patient were reviewed by me and considered in my medical decision making (see chart for details).   Patient is a 59 year old female presents laceration to the  right thumb.  See HPI.  Physical exam is consistent with laceration.  See procedure note for repair.  Patient tolerated procedure well.  Bleeding was controlled with direct pressure and with the sutures closing the skin.  She is to follow-up with your regular doctor in 1 week for suture removal.  Return emergency department worsening with any sign of infection.  Tdap was already up-to-date so would not need to update her Tdap.  She was discharged stable condition.     Sherri Foster was evaluated in Emergency Department on 03/02/2020 for the symptoms described in the history of present illness. She was evaluated in the context of the global COVID-19 pandemic, which necessitated consideration that the patient might be at risk for infection with the SARS-CoV-2 virus that causes COVID-19. Institutional protocols and algorithms that pertain to the evaluation of patients at risk for COVID-19 are in a state of rapid change based on information released by regulatory bodies including the CDC and federal and state organizations. These policies and algorithms were followed during the patient's care in the ED.    As part of my medical decision making, I reviewed the following data within the electronic MEDICAL RECORD NUMBER Nursing notes reviewed and incorporated, Old chart reviewed, Notes from prior ED visits  and [place] Controlled Substance Database  ____________________________________________   FINAL CLINICAL IMPRESSION(S) / ED DIAGNOSES  Final diagnoses:  Laceration of right thumb without foreign body without damage to nail, initial encounter      NEW MEDICATIONS STARTED DURING THIS VISIT:  New Prescriptions   No medications on file     Note:  This document was prepared using Dragon voice recognition software and may include unintentional dictation errors.    Faythe Ghee, PA-C 03/02/20 1842    Chesley Noon, MD 03/03/20 [phone]

## 2020-03-02 NOTE — ED Notes (Signed)
Pt with laceration to R thumb, pt states sliced thumb on slaw grater PTA, initially oozing upon arrival to ED, however bleeding controlled at this time. Pt states up to date on tetanus shot at this time.

## 2020-03-22 ENCOUNTER — Other Ambulatory Visit: Payer: Self-pay

## 2020-03-22 ENCOUNTER — Ambulatory Visit
Admission: RE | Admit: 2020-03-22 | Discharge: 2020-03-22 | Disposition: A | Discharge status: Home / Self Care | Payer: Medicare Other | Source: Ambulatory Visit | Attending: Family Medicine | Admitting: Family Medicine

## 2020-03-22 DIAGNOSIS — Z1231 Encounter for screening mammogram for malignant neoplasm of breast: Secondary | ICD-10-CM | POA: Insufficient documentation

## 2020-03-29 ENCOUNTER — Other Ambulatory Visit: Payer: Self-pay | Admitting: Family Medicine

## 2020-03-29 DIAGNOSIS — R928 Other abnormal and inconclusive findings on diagnostic imaging of breast: Secondary | ICD-10-CM

## 2020-03-29 DIAGNOSIS — N6320 Unspecified lump in the left breast, unspecified quadrant: Secondary | ICD-10-CM

## 2020-04-13 ENCOUNTER — Other Ambulatory Visit: Payer: Medicare Other

## 2020-04-13 ENCOUNTER — Ambulatory Visit: Payer: Medicare Other | Attending: Family Medicine

## 2020-05-02 ENCOUNTER — Ambulatory Visit
Admission: RE | Admit: 2020-05-02 | Discharge: 2020-05-02 | Disposition: A | Discharge status: Home / Self Care | Payer: Medicare Other | Source: Ambulatory Visit | Attending: Family Medicine | Admitting: Family Medicine

## 2020-05-02 ENCOUNTER — Other Ambulatory Visit: Payer: Self-pay

## 2020-05-02 DIAGNOSIS — R928 Other abnormal and inconclusive findings on diagnostic imaging of breast: Secondary | ICD-10-CM | POA: Diagnosis present

## 2020-05-02 DIAGNOSIS — N6320 Unspecified lump in the left breast, unspecified quadrant: Secondary | ICD-10-CM

## 2020-05-03 ENCOUNTER — Other Ambulatory Visit: Payer: Self-pay | Admitting: Family Medicine

## 2020-05-03 DIAGNOSIS — R928 Other abnormal and inconclusive findings on diagnostic imaging of breast: Secondary | ICD-10-CM

## 2020-05-03 DIAGNOSIS — N6320 Unspecified lump in the left breast, unspecified quadrant: Secondary | ICD-10-CM

## 2020-05-14 ENCOUNTER — Ambulatory Visit: Admission: RE | Admit: 2020-05-14 | Payer: Medicare Other | Source: Ambulatory Visit

## 2020-05-14 ENCOUNTER — Ambulatory Visit: Payer: Medicare Other | Attending: Family Medicine

## 2021-05-21 ENCOUNTER — Other Ambulatory Visit: Payer: Self-pay | Admitting: Family Medicine

## 2021-05-21 DIAGNOSIS — N6321 Unspecified lump in the left breast, upper outer quadrant: Secondary | ICD-10-CM

## 2021-06-18 ENCOUNTER — Other Ambulatory Visit: Payer: Medicare (Managed Care)

## 2021-06-26 ENCOUNTER — Other Ambulatory Visit: Payer: Self-pay | Admitting: Family Medicine

## 2021-06-26 DIAGNOSIS — B182 Chronic viral hepatitis C: Secondary | ICD-10-CM

## 2021-07-19 ENCOUNTER — Ambulatory Visit
Admission: RE | Admit: 2021-07-19 | Discharge: 2021-07-19 | Disposition: A | Discharge status: Home / Self Care | Payer: Medicare (Managed Care) | Source: Ambulatory Visit | Attending: Family Medicine | Admitting: Family Medicine

## 2021-07-19 DIAGNOSIS — N6321 Unspecified lump in the left breast, upper outer quadrant: Secondary | ICD-10-CM

## 2021-07-22 ENCOUNTER — Other Ambulatory Visit: Payer: Self-pay | Admitting: Family Medicine

## 2021-07-25 ENCOUNTER — Ambulatory Visit: Admission: RE | Admit: 2021-07-25 | Payer: Medicare (Managed Care) | Source: Ambulatory Visit

## 2021-07-25 ENCOUNTER — Other Ambulatory Visit: Payer: Self-pay | Admitting: Family Medicine

## 2021-07-25 DIAGNOSIS — R928 Other abnormal and inconclusive findings on diagnostic imaging of breast: Secondary | ICD-10-CM

## 2021-07-25 DIAGNOSIS — N63 Unspecified lump in unspecified breast: Secondary | ICD-10-CM

## 2021-07-26 ENCOUNTER — Ambulatory Visit
Admission: RE | Admit: 2021-07-26 | Discharge: 2021-07-26 | Disposition: A | Discharge status: Home / Self Care | Payer: Medicare (Managed Care) | Source: Ambulatory Visit | Attending: Family Medicine | Admitting: Family Medicine

## 2021-07-26 ENCOUNTER — Other Ambulatory Visit: Payer: Self-pay | Admitting: Family Medicine

## 2021-07-26 ENCOUNTER — Other Ambulatory Visit: Payer: Medicare (Managed Care)

## 2021-07-26 DIAGNOSIS — B182 Chronic viral hepatitis C: Secondary | ICD-10-CM

## 2021-08-06 ENCOUNTER — Ambulatory Visit
Admission: RE | Admit: 2021-08-06 | Discharge: 2021-08-06 | Disposition: A | Discharge status: Home / Self Care | Payer: Medicare (Managed Care) | Source: Ambulatory Visit | Attending: Family Medicine | Admitting: Family Medicine

## 2021-08-06 DIAGNOSIS — R928 Other abnormal and inconclusive findings on diagnostic imaging of breast: Secondary | ICD-10-CM | POA: Diagnosis not present

## 2021-08-06 DIAGNOSIS — N63 Unspecified lump in unspecified breast: Secondary | ICD-10-CM | POA: Diagnosis present

## 2021-08-06 HISTORY — PX: BREAST BIOPSY: SHX20

## 2021-08-07 LAB — SURGICAL PATHOLOGY

## 2021-12-23 IMAGING — US US BREAST*L* LIMITED INC AXILLA
1 series · 2 of 2 positions shown · non-contrast
Comparison: Previous exam(s).

CLINICAL DATA: Possible left breast mass seen on most recent
screening mammography.

EXAM:
DIGITAL DIAGNOSTIC UNILATERAL LEFT MAMMOGRAM WITH TOMOSYNTHESIS AND
CAD; ULTRASOUND LEFT BREAST LIMITED
TECHNIQUE: Left digital diagnostic mammography and breast tomosynthesis was
performed. The images were evaluated with computer-aided detection.;
Targeted ultrasound examination of the left breast was performed

[Series 1: us breast*left* limited inc axilla · 0.07mm/px · 2 of 2 slices shown]
[im 1/2]
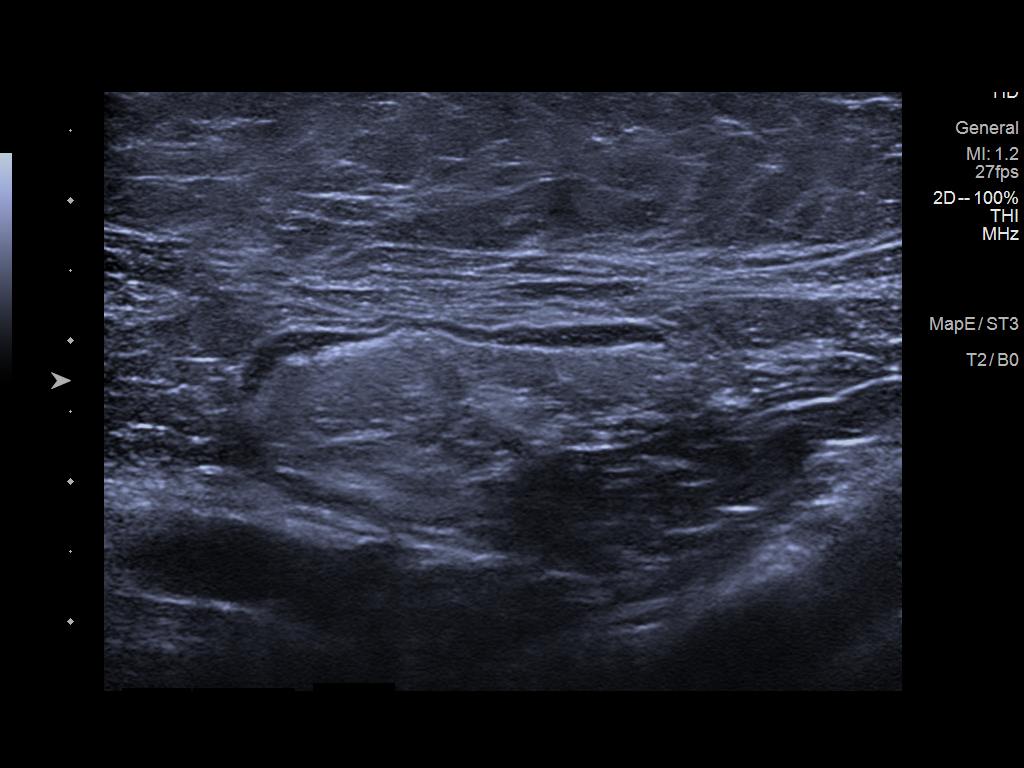
[im 2/2]
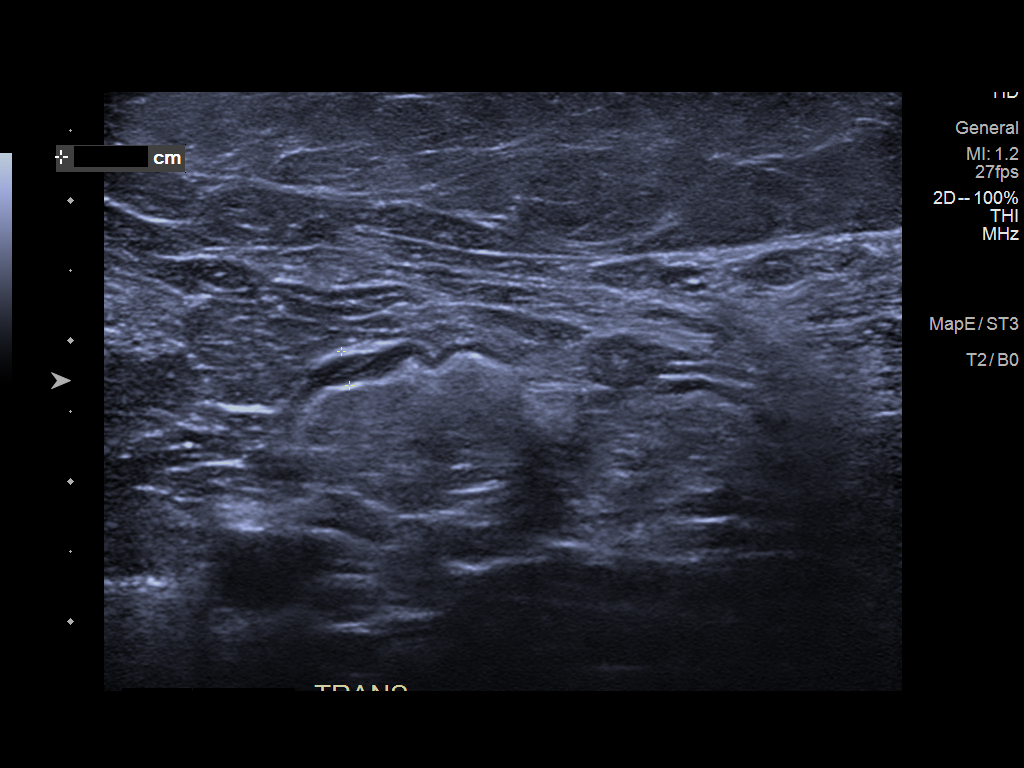

[2 of 2 positions shown; findings below may reference images not displayed]

ACR Breast Density Category b: There are scattered areas of
fibroglandular density.
FINDINGS: Additional mammographic views of the left breast demonstrate
persistent lobulated mass in the left breast upper outer quadrant,
posterior depth, measuring 1.1 cm mammographically.

Targeted left breast ultrasound was performed showing no specific
correlation to the mammographically seen mass. No suspicious masses
are seen sonographically. Examination of the left axilla
demonstrates no evidence of lymphadenopathy.
IMPRESSION: Left breast upper outer quadrant mammographically seen mass, without
sonographic correlation, for which stereotactic core needle biopsy
is recommended.

RECOMMENDATION:
One site stereotactic core needle biopsy of the left breast.

I have discussed the findings and recommendations with the patient.
If applicable, a reminder letter will be sent to the patient
regarding the next appointment.

BI-RADS CATEGORY  4: Suspicious.

## 2021-12-23 IMAGING — MG MM DIGITAL DIAGNOSTIC UNILAT*L* W/ TOMO W/ CAD
6 series · 6 of 18 positions shown · non-contrast
Comparison: Previous exam(s).

CLINICAL DATA: Possible left breast mass seen on most recent
screening mammography.

EXAM:
DIGITAL DIAGNOSTIC UNILATERAL LEFT MAMMOGRAM WITH TOMOSYNTHESIS AND
CAD; ULTRASOUND LEFT BREAST LIMITED
TECHNIQUE: Left digital diagnostic mammography and breast tomosynthesis was
performed. The images were evaluated with computer-aided detection.;
Targeted ultrasound examination of the left breast was performed

[L MLO synth-2D]
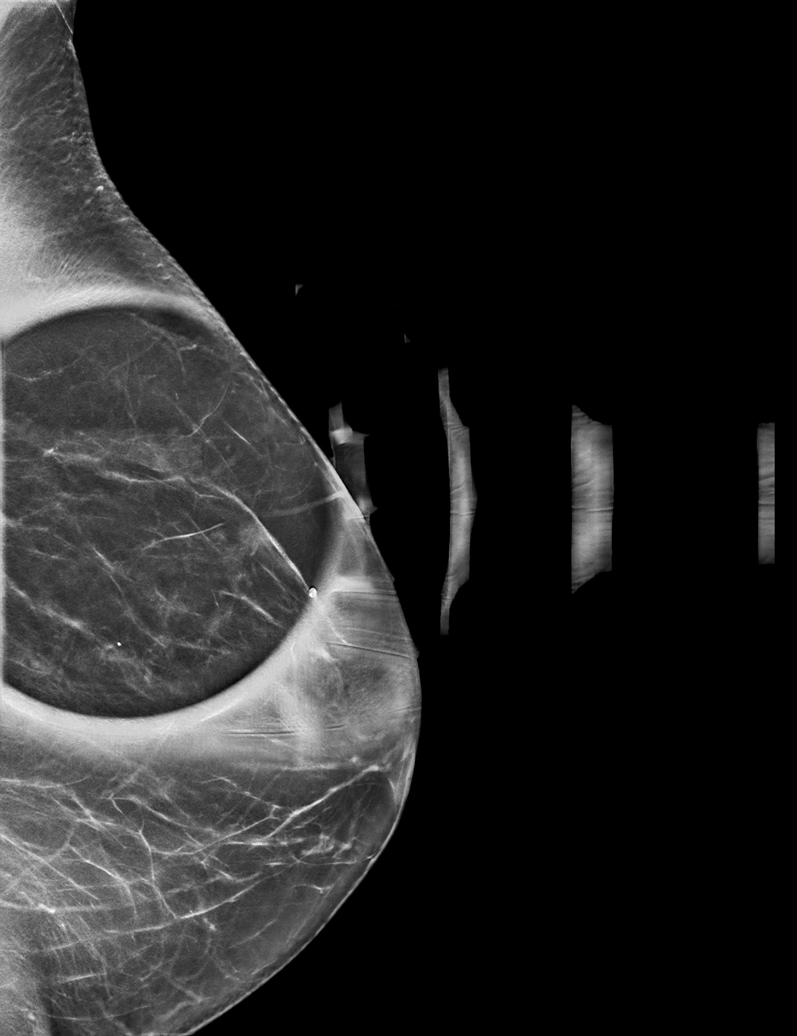

[L ML synth-2D]
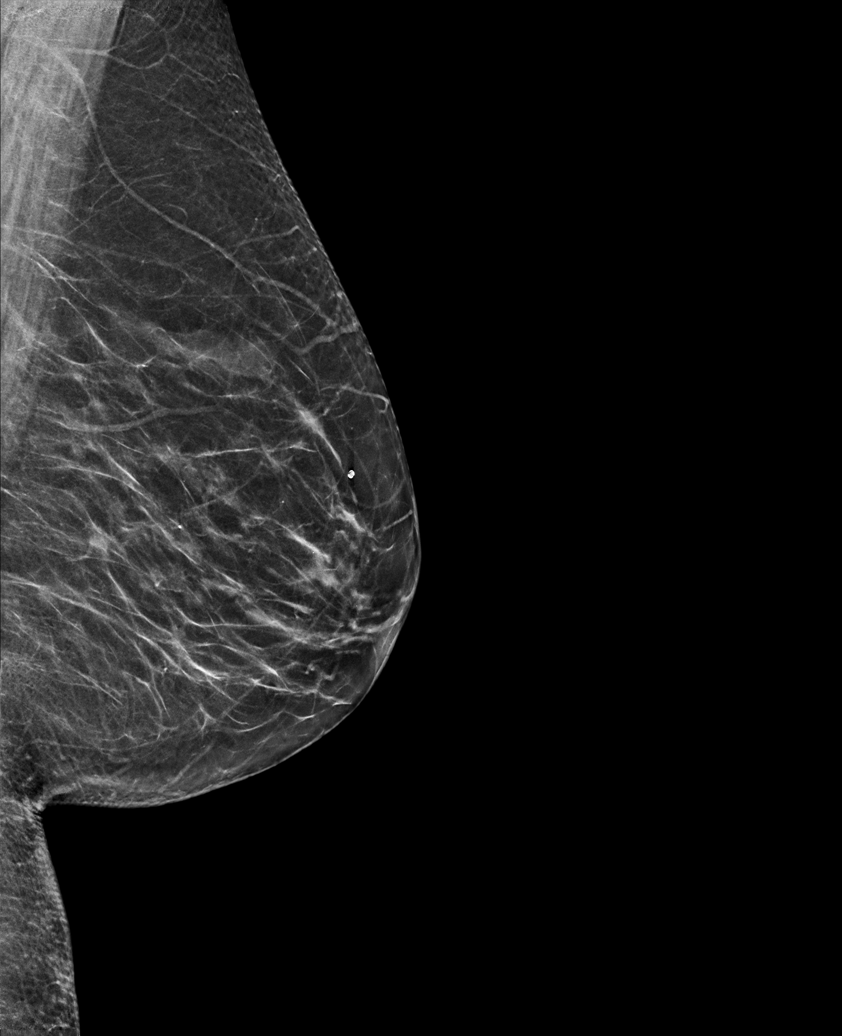

[L CC synth-2D]
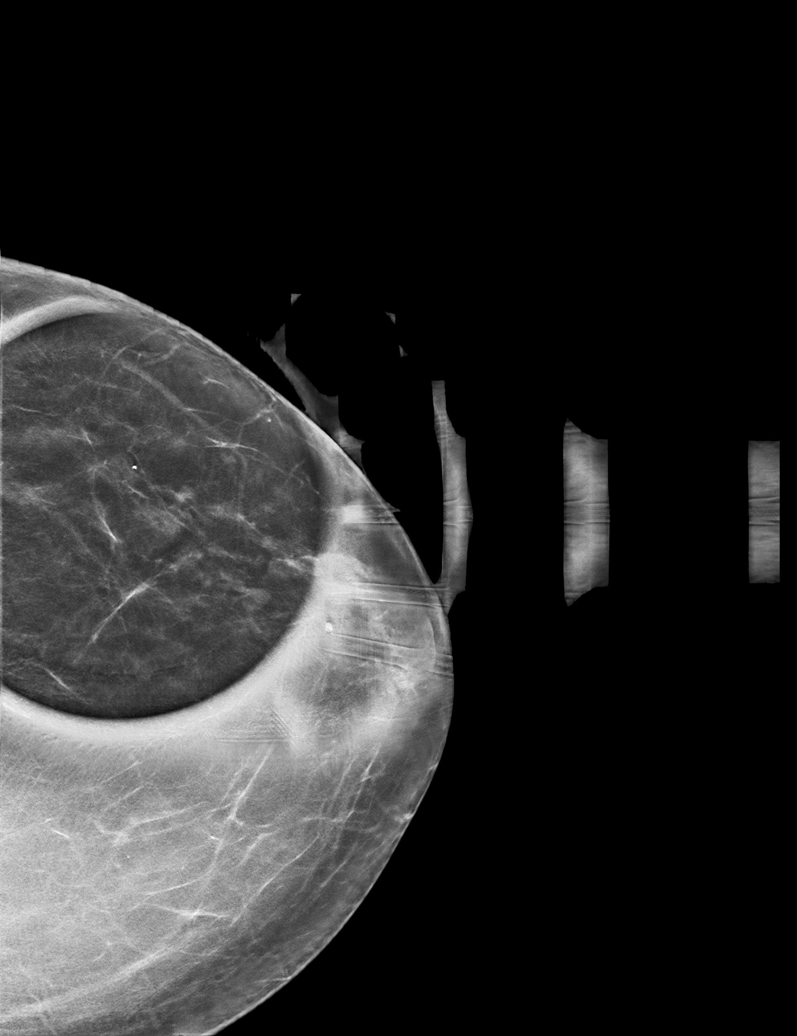

[L ML tomo · tomo slice 31/60.0]
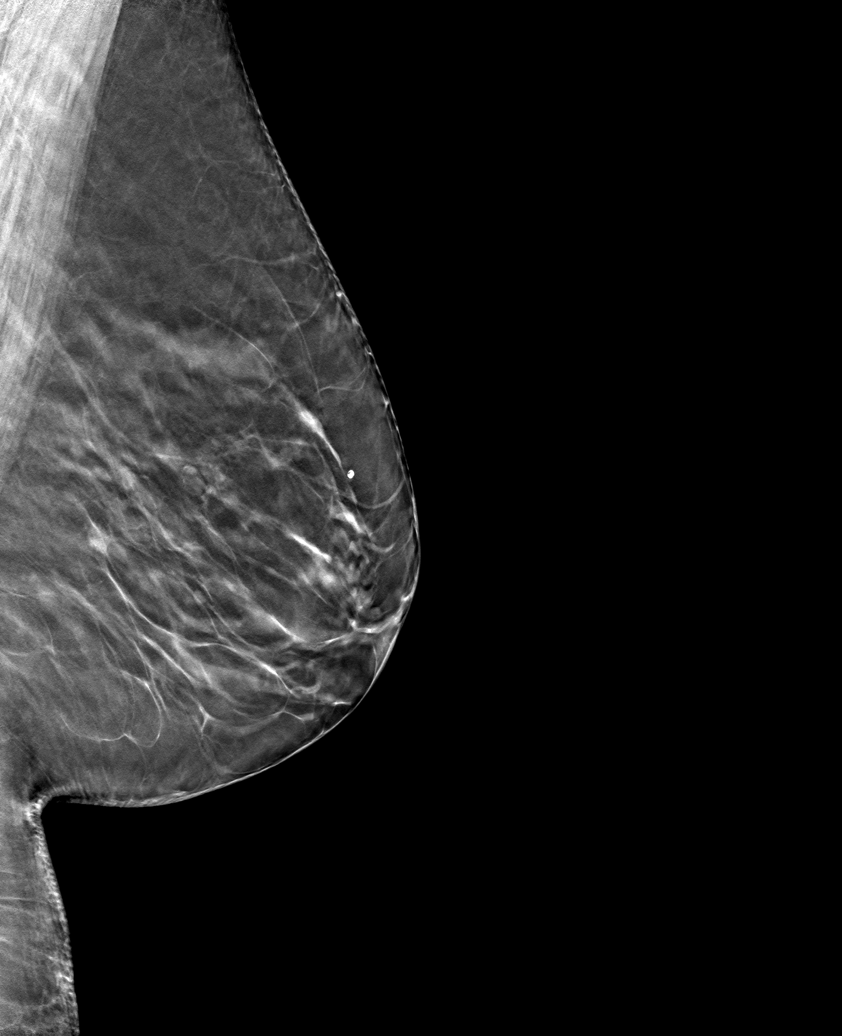

[L MLO tomo · tomo slice 33/66.0]
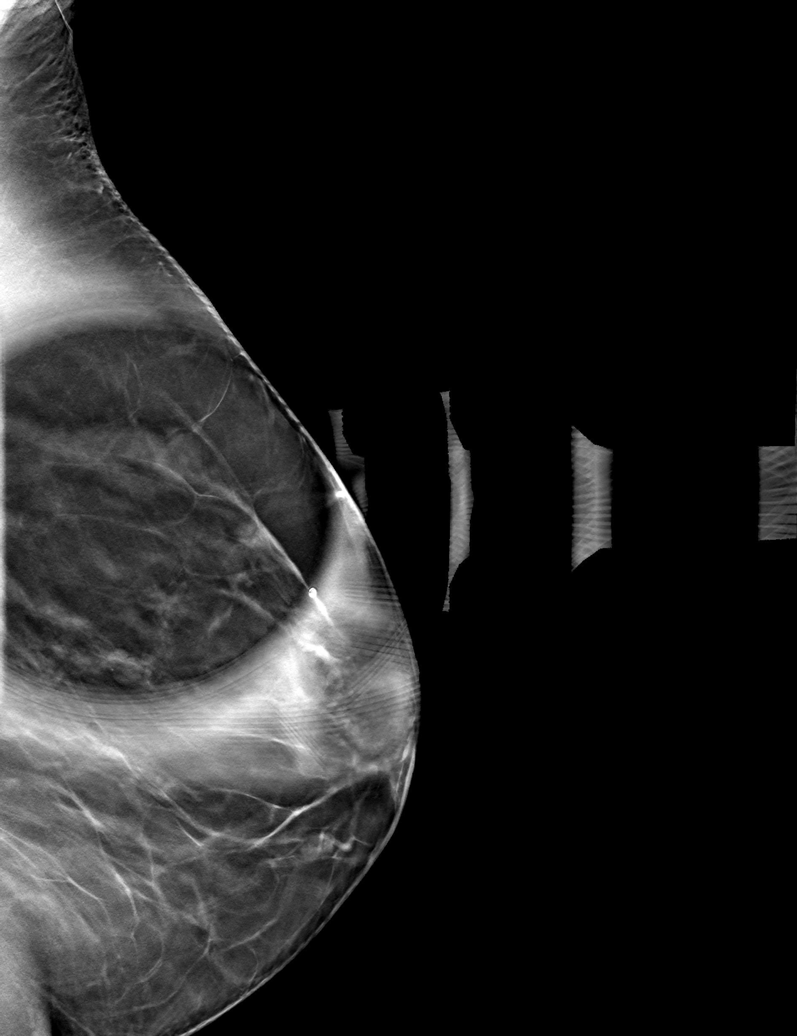

[L CC tomo · tomo slice 31/62.0]
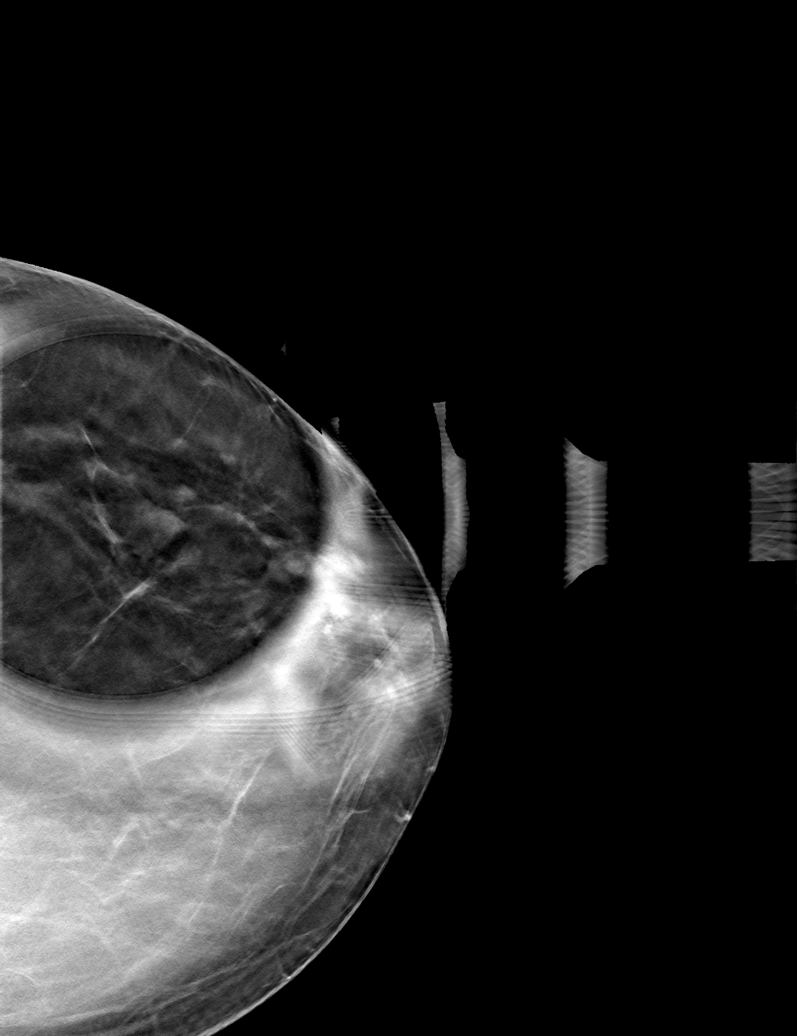

[6 of 18 positions shown; findings below may reference images not displayed]

ACR Breast Density Category b: There are scattered areas of
fibroglandular density.
FINDINGS: Additional mammographic views of the left breast demonstrate
persistent lobulated mass in the left breast upper outer quadrant,
posterior depth, measuring 1.1 cm mammographically.

Targeted left breast ultrasound was performed showing no specific
correlation to the mammographically seen mass. No suspicious masses
are seen sonographically. Examination of the left axilla
demonstrates no evidence of lymphadenopathy.
IMPRESSION: Left breast upper outer quadrant mammographically seen mass, without
sonographic correlation, for which stereotactic core needle biopsy
is recommended.

RECOMMENDATION:
One site stereotactic core needle biopsy of the left breast.

I have discussed the findings and recommendations with the patient.
If applicable, a reminder letter will be sent to the patient
regarding the next appointment.

BI-RADS CATEGORY  4: Suspicious.

## 2022-06-18 ENCOUNTER — Other Ambulatory Visit: Payer: Self-pay | Admitting: Family Medicine

## 2022-06-18 DIAGNOSIS — Z1231 Encounter for screening mammogram for malignant neoplasm of breast: Secondary | ICD-10-CM

## 2022-09-25 ENCOUNTER — Ambulatory Visit
Admission: RE | Admit: 2022-09-25 | Discharge: 2022-09-25 | Disposition: A | Discharge status: Home / Self Care | Payer: Medicare (Managed Care) | Source: Ambulatory Visit | Attending: Family Medicine | Admitting: Family Medicine

## 2022-09-25 DIAGNOSIS — Z1231 Encounter for screening mammogram for malignant neoplasm of breast: Secondary | ICD-10-CM | POA: Diagnosis not present

## 2022-09-26 ENCOUNTER — Encounter: Payer: Self-pay | Admitting: Family Medicine

## 2022-09-29 ENCOUNTER — Other Ambulatory Visit: Payer: Self-pay | Admitting: Family Medicine

## 2022-09-29 DIAGNOSIS — R928 Other abnormal and inconclusive findings on diagnostic imaging of breast: Secondary | ICD-10-CM

## 2022-09-29 DIAGNOSIS — N63 Unspecified lump in unspecified breast: Secondary | ICD-10-CM

## 2022-11-24 ENCOUNTER — Other Ambulatory Visit: Payer: Medicare (Managed Care)

## 2022-12-20 ENCOUNTER — Other Ambulatory Visit: Payer: Self-pay | Admitting: Family Medicine

## 2022-12-20 ENCOUNTER — Other Ambulatory Visit: Payer: Self-pay | Admitting: Medical Oncology

## 2022-12-20 DIAGNOSIS — M25562 Pain in left knee: Secondary | ICD-10-CM

## 2022-12-22 ENCOUNTER — Other Ambulatory Visit: Payer: Self-pay | Admitting: Family Medicine

## 2022-12-22 DIAGNOSIS — K760 Fatty (change of) liver, not elsewhere classified: Secondary | ICD-10-CM

## 2022-12-31 ENCOUNTER — Encounter: Payer: Self-pay | Admitting: Internal Medicine

## 2023-01-05 ENCOUNTER — Ambulatory Visit
Admission: RE | Admit: 2023-01-05 | Discharge: 2023-01-05 | Disposition: A | Discharge status: Home / Self Care | Payer: Medicare (Managed Care) | Source: Ambulatory Visit | Attending: Family Medicine | Admitting: Family Medicine

## 2023-01-05 DIAGNOSIS — K760 Fatty (change of) liver, not elsewhere classified: Secondary | ICD-10-CM | POA: Insufficient documentation

## 2023-01-07 ENCOUNTER — Ambulatory Visit: Payer: Medicare (Managed Care) | Admitting: Certified Registered"

## 2023-01-07 ENCOUNTER — Ambulatory Visit
Admission: RE | Admit: 2023-01-07 | Discharge: 2023-01-07 | Disposition: A | Discharge status: Home / Self Care | Payer: Medicare (Managed Care) | Attending: Internal Medicine | Admitting: Internal Medicine

## 2023-01-07 ENCOUNTER — Encounter: Payer: Self-pay | Admitting: Internal Medicine

## 2023-01-07 ENCOUNTER — Encounter
Admission: RE | Disposition: A | Discharge status: Home / Self Care | Payer: Self-pay | Source: Home / Self Care | Attending: Internal Medicine

## 2023-01-07 DIAGNOSIS — E785 Hyperlipidemia, unspecified: Secondary | ICD-10-CM | POA: Diagnosis not present

## 2023-01-07 DIAGNOSIS — K5730 Diverticulosis of large intestine without perforation or abscess without bleeding: Secondary | ICD-10-CM | POA: Insufficient documentation

## 2023-01-07 DIAGNOSIS — F17210 Nicotine dependence, cigarettes, uncomplicated: Secondary | ICD-10-CM | POA: Insufficient documentation

## 2023-01-07 DIAGNOSIS — Z1211 Encounter for screening for malignant neoplasm of colon: Secondary | ICD-10-CM | POA: Insufficient documentation

## 2023-01-07 DIAGNOSIS — Z7951 Long term (current) use of inhaled steroids: Secondary | ICD-10-CM | POA: Insufficient documentation

## 2023-01-07 DIAGNOSIS — F319 Bipolar disorder, unspecified: Secondary | ICD-10-CM | POA: Insufficient documentation

## 2023-01-07 DIAGNOSIS — J449 Chronic obstructive pulmonary disease, unspecified: Secondary | ICD-10-CM | POA: Insufficient documentation

## 2023-01-07 DIAGNOSIS — D175 Benign lipomatous neoplasm of intra-abdominal organs: Secondary | ICD-10-CM | POA: Diagnosis not present

## 2023-01-07 DIAGNOSIS — D123 Benign neoplasm of transverse colon: Secondary | ICD-10-CM | POA: Insufficient documentation

## 2023-01-07 DIAGNOSIS — I10 Essential (primary) hypertension: Secondary | ICD-10-CM | POA: Diagnosis not present

## 2023-01-07 DIAGNOSIS — G40909 Epilepsy, unspecified, not intractable, without status epilepticus: Secondary | ICD-10-CM | POA: Diagnosis not present

## 2023-01-07 DIAGNOSIS — K640 First degree hemorrhoids: Secondary | ICD-10-CM | POA: Insufficient documentation

## 2023-01-07 HISTORY — PX: COLONOSCOPY WITH PROPOFOL: SHX5780

## 2023-01-07 HISTORY — PX: POLYPECTOMY: SHX5525

## 2023-01-07 HISTORY — PX: BIOPSY: SHX5522

## 2023-01-07 SURGERY — COLONOSCOPY WITH PROPOFOL
Anesthesia: General

## 2023-01-07 MED ORDER — PROPOFOL 500 MG/50ML IV EMUL
INTRAVENOUS | Status: DC | PRN
  Administered 2023-01-07: 150 ug/kg/min via INTRAVENOUS

## 2023-01-07 MED ORDER — SODIUM CHLORIDE 0.9 % IV SOLN
INTRAVENOUS | Status: DC
  Administered 2023-01-07: 500 mL via INTRAVENOUS

## 2023-01-07 MED ORDER — PROPOFOL 10 MG/ML IV BOLUS
INTRAVENOUS | Status: DC | PRN
  Administered 2023-01-07: 80 mg via INTRAVENOUS
  Administered 2023-01-07: 20 mg via INTRAVENOUS

## 2023-01-07 MED ORDER — LIDOCAINE HCL (CARDIAC) PF 100 MG/5ML IV SOSY
PREFILLED_SYRINGE | INTRAVENOUS | Status: DC | PRN
  Administered 2023-01-07: 50 mg via INTRAVENOUS

## 2023-01-07 NOTE — Anesthesia Procedure Notes (Signed)
Procedure Name: MAC Date/Time: 01/07/2023 11:38 AM  Performed by: Cheral Bay, CRNAPre-anesthesia Checklist: Patient identified, Emergency Drugs available, Suction available, Patient being monitored and Timeout performed Patient Re-evaluated:Patient Re-evaluated prior to induction Oxygen Delivery Method: Nasal cannula Induction Type: IV induction Placement Confirmation: positive ETCO2 and CO2 detector

## 2023-01-07 NOTE — Anesthesia Preprocedure Evaluation (Addendum)
Anesthesia Evaluation  Patient identified by MRN, date of birth, ID band Patient awake    Reviewed: Allergy & Precautions, H&P , NPO status , Patient's Chart, lab work & pertinent test results  Airway Mallampati: III  TM Distance: >3 FB Neck ROM: full    Dental  (+) Upper Dentures   Pulmonary asthma , COPD, Current Smoker and Patient abstained from smoking.   Pulmonary exam normal        Cardiovascular hypertension, Normal cardiovascular exam     Neuro/Psych Seizures -, Well Controlled,  PSYCHIATRIC DISORDERS         GI/Hepatic negative GI ROS, Neg liver ROS,,,  Endo/Other  negative endocrine ROS    Renal/GU negative Renal ROS  negative genitourinary   Musculoskeletal  (+) Arthritis ,    Abdominal  (+) + obese  Peds  Hematology negative hematology ROS (+)   Anesthesia Other Findings Past Medical History: No date: Allergy No date: Arthritis No date: Back pain No date: Bipolar 1 disorder (HCC) No date: Blood transfusion without reported diagnosis No date: COPD (chronic obstructive pulmonary disease) (HCC) No date: Depression No date: Hepatitis C No date: Hyperlipidemia No date: Hypertension No date: Opiate use     Comment:  long term use of opiate analgesics. No date: Seizures [facility])  Past Surgical History: No date: ABDOMINAL HYSTERECTOMY 08/06/2021: BREAST BIOPSY; Left     Comment:  stereo bx-mass-"RIBBON" clip-path pending  BMI    Body Mass Index: 30.70 kg/m      Reproductive/Obstetrics negative OB ROS                             Anesthesia Physical Anesthesia Plan  ASA: 3  Anesthesia Plan: General   Post-op Pain Management: Minimal or no pain anticipated   Induction: Intravenous  PONV Risk Score and Plan: Propofol infusion and TIVA  Airway Management Planned: Natural Airway  Additional Equipment:   Intra-op Plan:   Post-operative Plan:   Informed Consent: I  have reviewed the patients History and Physical, chart, labs and discussed the procedure including the risks, benefits and alternatives for the proposed anesthesia with the patient or authorized representative who has indicated his/her understanding and acceptance.     Dental Advisory Given  Plan Discussed with: CRNA and Surgeon  Anesthesia Plan Comments:         Anesthesia Quick Evaluation

## 2023-01-07 NOTE — H&P (Signed)
Outpatient short stay form Pre-procedure 01/07/2023 10:26 AM Nagee Goates K. Norma Fredrickson, M.D.  Primary Physician: Angus Palms, M.D.  Reason for visit:  Colon cancer screening  History of present illness:  Patient presents for colonoscopy for colon cancer screening. The patient denies complaints of abdominal pain, significant change in bowel habits, or rectal bleeding.     No current facility-administered medications for this encounter.  Medications Prior to Admission  Medication Sig Dispense Refill Last Dose   albuterol (PROVENTIL HFA;VENTOLIN HFA) 108 (90 BASE) MCG/ACT inhaler Inhale 2 puffs into the lungs every 6 (six) hours as needed for wheezing or shortness of breath.      cetirizine (ZYRTEC) 10 MG tablet Take 10 mg by mouth daily.      EPINEPHrine 0.3 mg/0.3 mL IJ SOAJ injection Inject 0.3 mg into the muscle once as needed (for severe allergic reaction).       FLUoxetine (PROZAC) 20 MG capsule Take 20 mg by mouth daily.      fluticasone (FLONASE) 50 MCG/ACT nasal spray Place 2 sprays into both nostrils daily.      fluticasone furoate-vilanterol (BREO ELLIPTA) 100-25 MCG/INH AEPB Inhale 1 puff into the lungs daily. 1 each 0    Fluticasone-Salmeterol (ADVAIR) 250-50 MCG/DOSE AEPB Inhale 1 puff into the lungs every 12 (twelve) hours.      gabapentin (NEURONTIN) 300 MG capsule Take 300 mg by mouth 3 (three) times daily.      hydrochlorothiazide (HYDRODIURIL) 12.5 MG tablet Take 0.5 tablets (6.25 mg total) by mouth daily. Reported on 09/27/2015 15 tablet 0    ipratropium-albuterol (DUONEB) 0.5-2.5 (3) MG/3ML SOLN Take 3 mLs by nebulization every 4 (four) hours as needed (for shortness of breath).       levETIRAcetam (KEPPRA) 500 MG tablet Take 500 mg by mouth 2 (two) times daily.      simvastatin (ZOCOR) 40 MG tablet Take 40 mg by mouth at bedtime.       tiZANidine (ZANAFLEX) 2 MG tablet Take 2 mg by mouth daily.       traZODone (DESYREL) 150 MG tablet Take 150 mg by mouth at bedtime.         Allergies  Allergen Reactions   Amitriptyline Anaphylaxis   Other Shortness Of Breath and Itching    ragweed   Potassium Chloride Shortness Of Breath   Potassium-Containing Compounds Shortness Of Breath   Sulfa Antibiotics Other (See Comments)   Topical Sulfur Other (See Comments)    Reaction:  Unknown    Aspirin Rash and Other (See Comments)    Breaks mouth out Other reaction(s): OTHER all over my body   Codeine Rash    codeine with tylenol   Latex Rash    Other reaction(s): UNKNOWN Other reaction(s): UNKNOWN   Topiramate Nausea And Vomiting   Tylenol [Acetaminophen] Rash     Past Medical History:  Diagnosis Date   Allergy    Arthritis    Back pain    Bipolar 1 disorder (HCC)    Blood transfusion without reported diagnosis    COPD (chronic obstructive pulmonary disease) (HCC)    Depression    Hepatitis C    Hyperlipidemia    Hypertension    Opiate use    long term use of opiate analgesics.   Seizures (HCC)     Review of systems:  Otherwise negative.    Physical Exam  Gen: Alert, oriented. Appears stated age.  HEENT: [place]/AT. PERRLA. Lungs: CTA, no wheezes. CV: RR nl S1, S2. Abd: soft, benign,  no masses. BS+ Ext: No edema. Pulses 2+    Planned procedures: Proceed with colonoscopy. The patient understands the nature of the planned procedure, indications, risks, alternatives and potential complications including but not limited to bleeding, infection, perforation, damage to internal organs and possible oversedation/side effects from anesthesia. The patient agrees and gives consent to proceed.  Please refer to procedure notes for findings, recommendations and patient disposition/instructions.     Tryone Kille K. Norma Fredrickson, M.D. Gastroenterology 01/07/2023  10:26 AM

## 2023-01-07 NOTE — Interval H&P Note (Signed)
History and Physical Interval Note:  01/07/2023 10:27 AM  Yvette Rack  has presented today for surgery, with the diagnosis of V76.51 (ICD-9-CM) - Z12.11 (ICD-10-CM) - Colon cancer screening.  The various methods of treatment have been discussed with the patient and family. After consideration of risks, benefits and other options for treatment, the patient has consented to  Procedure(s): COLONOSCOPY WITH PROPOFOL (N/A) as a surgical intervention.  The patient's history has been reviewed, patient examined, no change in status, stable for surgery.  I have reviewed the patient's chart and labs.  Questions were answered to the patient's satisfaction.     [place], Wallace

## 2023-01-07 NOTE — Anesthesia Postprocedure Evaluation (Signed)
Anesthesia Post Note  Patient: Sherri Foster  Procedure(s) Performed: COLONOSCOPY WITH PROPOFOL BIOPSY POLYPECTOMY  Patient location during evaluation: Endoscopy Anesthesia Type: General Level of consciousness: awake and alert Pain management: pain level controlled Vital Signs Assessment: post-procedure vital signs reviewed and stable Respiratory status: spontaneous breathing, nonlabored ventilation and respiratory function stable Cardiovascular status: blood pressure returned to baseline and stable Postop Assessment: no apparent nausea or vomiting Anesthetic complications: no   No notable events documented.   Last Vitals:  Vitals:   01/07/23 1213 01/07/23 1223  BP: (!) 141/95 (!) 156/82  Pulse: 75 66  Resp: 20 (!) 24  Temp:    SpO2: 95% 97%    Last Pain:  Vitals:   01/07/23 1213  TempSrc:   PainSc: 0-No pain                 Foye Deer

## 2023-01-07 NOTE — Op Note (Signed)
[facility] Gastroenterology Patient Name: Sherri Foster Procedure Date: 01/07/2023 11:28 AM MRN: [id number] Account #: [id number] Date of Birth: 11-09-1960 Admit Type: Outpatient Age: 62 Room: [facility] ENDO ROOM 2 Gender: Female Note Status: Finalized Instrument Name: Prentice Docker [id number] Procedure:             Colonoscopy Indications:           Screening for colorectal malignant neoplasm Providers:             Royce Macadamia K. Norma Fredrickson MD, MD Referring MD:          Marylin Crosby. Greggory Stallion MD, MD (Referring MD) Medicines:             Propofol per Anesthesia Complications:         No immediate complications. Estimated blood loss: None. Procedure:             Pre-Anesthesia Assessment:                        - The risks and benefits of the procedure and the                         sedation options and risks were discussed with the                         patient. All questions were answered and informed                         consent was obtained.                        - Patient identification and proposed procedure were                         verified prior to the procedure by the nurse. The                         procedure was verified in the procedure room.                        - ASA Grade Assessment: III - A patient with severe                         systemic disease.                        - After reviewing the risks and benefits, the patient                         was deemed in satisfactory condition to undergo the                         procedure.                        After obtaining informed consent, the colonoscope was                         passed under direct vision. Throughout the procedure,  the patient's blood pressure, pulse, and oxygen                         saturations were monitored continuously. The                         Colonoscope was introduced through the anus and                         advanced to the the cecum,  identified by appendiceal                         orifice and ileocecal valve. The colonoscopy was                         performed without difficulty. The patient tolerated                         the procedure well. The quality of the bowel                         preparation was good. The ileocecal valve, appendiceal                         orifice, and rectum were photographed. Findings:      The perianal and digital rectal examinations were normal. Pertinent       negatives include normal sphincter tone and no palpable rectal lesions.      Non-bleeding internal hemorrhoids were found during retroflexion. The       hemorrhoids were Grade I (internal hemorrhoids that do not prolapse).      Many small-mouthed diverticula were found in the sigmoid colon.      There was a small lipoma, 19 mm in diameter, at the hepatic flexure.       Biopsies were taken with a cold forceps for histology.      A 15 mm polyp was found in the distal transverse colon. The polyp was       sessile. The polyp was removed with a hot snare. Resection and retrieval       were complete.      The exam was otherwise without abnormality. Impression:            - Non-bleeding internal hemorrhoids.                        - Diverticulosis in the sigmoid colon.                        - Small lipoma at the hepatic flexure. Biopsied.                        - One 15 mm polyp in the distal transverse colon,                         removed with a hot snare. Resected and retrieved.                        - The examination was otherwise normal. Recommendation:        - Patient has a contact number  available for                         emergencies. The signs and symptoms of potential                         delayed complications were discussed with the patient.                         Return to normal activities tomorrow. Written                         discharge instructions were provided to the patient.                         - Resume previous diet.                        - Continue present medications.                        - Repeat colonoscopy is recommended for surveillance.                         The colonoscopy date will be determined after                         pathology results from today's exam become available                         for review.                        - Return to GI office PRN.                        - The findings and recommendations were discussed with                         the patient. Procedure Code(s):     --- Professional ---                        [phone], Colonoscopy, flexible; with removal of                         tumor(s), polyp(s), or other lesion(s) by snare                         technique                        45380, 59, Colonoscopy, flexible; with biopsy, single                         or multiple Diagnosis Code(s):     --- Professional ---                        K57.30, Diverticulosis of large intestine without                         perforation or abscess without  bleeding                        D12.3, Benign neoplasm of transverse colon (hepatic                         flexure or splenic flexure)                        D17.5, Benign lipomatous neoplasm of intra-abdominal                         organs                        K64.0, First degree hemorrhoids                        Z12.11, Encounter for screening for malignant neoplasm                         of colon CPT copyright 2022 [employer]. All rights reserved. The codes documented in this report are preliminary and upon coder review may  be revised to meet current compliance requirements. Stanton Kidney MD, MD 01/07/2023 12:13:22 PM This report has been signed electronically. Number of Addenda: 0 Note Initiated On: 01/07/2023 11:28 AM Scope Withdrawal Time: 0 hours 9 minutes 36 seconds  Total Procedure Duration: 0 hours 15 minutes 59 seconds  Estimated Blood Loss:  Estimated  blood loss: none.      [facility]

## 2023-01-07 NOTE — Transfer of Care (Signed)
Immediate Anesthesia Transfer of Care Note  Patient: Sherri Foster  Procedure(s) Performed: COLONOSCOPY WITH PROPOFOL BIOPSY POLYPECTOMY  Patient Location: PACU  Anesthesia Type:General  Level of Consciousness: awake, alert , and oriented  Airway & Oxygen Therapy: Patient Spontanous Breathing  Post-op Assessment: Report given to RN and Post -op Vital signs reviewed and stable  Post vital signs: Reviewed and stable  Last Vitals:  Vitals Value Taken Time  BP 117/66 01/07/23 1201  Temp    Pulse 67 01/07/23 1201  Resp 19 01/07/23 1201  SpO2 96 % 01/07/23 1201  Vitals shown include unfiled device data.  Last Pain:  Vitals:   01/07/23 1038  TempSrc: Temporal  PainSc: 0-No pain         Complications: No notable events documented.

## 2023-01-08 ENCOUNTER — Encounter: Payer: Self-pay | Admitting: Internal Medicine

## 2023-01-09 LAB — SURGICAL PATHOLOGY

## 2023-01-13 ENCOUNTER — Ambulatory Visit
Admission: RE | Admit: 2023-01-13 | Discharge: 2023-01-13 | Disposition: A | Discharge status: Home / Self Care | Payer: Medicare (Managed Care) | Source: Ambulatory Visit | Attending: Family Medicine | Admitting: Family Medicine

## 2023-01-13 DIAGNOSIS — N63 Unspecified lump in unspecified breast: Secondary | ICD-10-CM | POA: Diagnosis present

## 2023-01-13 DIAGNOSIS — R928 Other abnormal and inconclusive findings on diagnostic imaging of breast: Secondary | ICD-10-CM | POA: Insufficient documentation

## 2023-06-19 ENCOUNTER — Other Ambulatory Visit: Payer: Self-pay | Admitting: Family Medicine

## 2023-06-19 DIAGNOSIS — Z1231 Encounter for screening mammogram for malignant neoplasm of breast: Secondary | ICD-10-CM

## 2023-12-10 ENCOUNTER — Ambulatory Visit
Admission: RE | Admit: 2023-12-10 | Discharge: 2023-12-10 | Disposition: A | Discharge status: Home / Self Care | Payer: Medicare (Managed Care) | Source: Ambulatory Visit | Attending: Family Medicine | Admitting: Family Medicine

## 2023-12-10 DIAGNOSIS — Z1231 Encounter for screening mammogram for malignant neoplasm of breast: Secondary | ICD-10-CM | POA: Insufficient documentation

## 2023-12-18 ENCOUNTER — Other Ambulatory Visit: Payer: Self-pay | Admitting: Family Medicine

## 2023-12-18 DIAGNOSIS — K760 Fatty (change of) liver, not elsewhere classified: Secondary | ICD-10-CM

## 2024-01-01 ENCOUNTER — Ambulatory Visit
Admission: RE | Admit: 2024-01-01 | Discharge: 2024-01-01 | Disposition: A | Discharge status: Home / Self Care | Payer: Medicare (Managed Care) | Source: Ambulatory Visit | Attending: Family Medicine | Admitting: Family Medicine

## 2024-01-01 DIAGNOSIS — K760 Fatty (change of) liver, not elsewhere classified: Secondary | ICD-10-CM | POA: Insufficient documentation
